# Patient Record
Sex: Male | Born: 1950
Health system: Southern US, Community
[De-identification: ages and names within clinical notes are randomized; demographics above are authoritative.]

## PROBLEM LIST (undated history)

## (undated) DIAGNOSIS — F329 Major depressive disorder, single episode, unspecified: Secondary | ICD-10-CM

## (undated) DIAGNOSIS — F4310 Post-traumatic stress disorder, unspecified: Secondary | ICD-10-CM

## (undated) DIAGNOSIS — F32A Depression, unspecified: Secondary | ICD-10-CM

## (undated) HISTORY — PX: SINUSOTOMY: SHX291

## (undated) HISTORY — DX: Major depressive disorder, single episode, unspecified: F32.9

## (undated) HISTORY — PX: OTHER SURGICAL HISTORY: SHX169

## (undated) HISTORY — DX: Depression, unspecified: F32.A

## (undated) HISTORY — PX: ANKLE FRACTURE SURGERY: SHX122

## (undated) HISTORY — DX: Post-traumatic stress disorder, unspecified: F43.10

---

## 2003-10-18 ENCOUNTER — Encounter: Admission: RE | Admit: 2003-10-18 | Discharge: 2003-10-18 | Payer: Self-pay | Admitting: Family Medicine

## 2007-01-29 ENCOUNTER — Emergency Department: Payer: Self-pay | Admitting: Emergency Medicine

## 2007-08-27 ENCOUNTER — Ambulatory Visit: Payer: Self-pay | Admitting: Gastroenterology

## 2007-09-10 ENCOUNTER — Ambulatory Visit: Payer: Self-pay | Admitting: Gastroenterology

## 2008-05-07 ENCOUNTER — Emergency Department: Payer: Self-pay | Admitting: Emergency Medicine

## 2008-05-10 ENCOUNTER — Ambulatory Visit: Payer: Self-pay | Admitting: Unknown Physician Specialty

## 2008-08-23 ENCOUNTER — Telehealth: Payer: Self-pay | Admitting: Gastroenterology

## 2008-08-24 ENCOUNTER — Encounter (INDEPENDENT_AMBULATORY_CARE_PROVIDER_SITE_OTHER): Payer: Self-pay | Admitting: *Deleted

## 2010-08-13 NOTE — Letter (Signed)
Summary: Recall Colonoscopy Letter  [facility]  [address] [place], [place] [address]   Phone: [phone]  Fax: [phone]      August 24, 2008 MRN: [id number]   ATIBA KIMBERLIN [address] [place], [place]  [address]   Dear Mr. KERSH,   According to your medical record, it is time for you to schedule a Colonoscopy. The American Cancer Society recommends this procedure as a method to detect early colon cancer. Patients with a family history of colon cancer, or a personal history of colon polyps or inflammatory bowel disease are at increased risk.  This letter has beeen generated based on the recommendations made at the time of your procedure. If you feel that in your particular situation this may no longer apply, please contact our office.  Please call our office at [phone] to schedule this appointment or to update your records at your earliest convenience.  Thank you for cooperating with [place] to provide ou with the very best care possible.   Sincerely,  Vania Rea. Jarold Motto, M.D.  [facility] [phone]

## 2014-08-31 DIAGNOSIS — R05 Cough: Secondary | ICD-10-CM | POA: Diagnosis not present

## 2014-09-04 DIAGNOSIS — R05 Cough: Secondary | ICD-10-CM | POA: Diagnosis not present

## 2014-09-04 DIAGNOSIS — R918 Other nonspecific abnormal finding of lung field: Secondary | ICD-10-CM | POA: Diagnosis not present

## 2014-12-04 ENCOUNTER — Other Ambulatory Visit: Payer: Self-pay | Admitting: Family Medicine

## 2014-12-04 DIAGNOSIS — R911 Solitary pulmonary nodule: Secondary | ICD-10-CM

## 2014-12-06 DIAGNOSIS — Z79899 Other long term (current) drug therapy: Secondary | ICD-10-CM | POA: Diagnosis not present

## 2014-12-08 ENCOUNTER — Ambulatory Visit
Admission: RE | Admit: 2014-12-08 | Discharge: 2014-12-08 | Disposition: A | Discharge status: Home / Self Care | Payer: Commercial Managed Care - HMO | Source: Ambulatory Visit | Attending: Family Medicine | Admitting: Family Medicine

## 2014-12-08 DIAGNOSIS — J929 Pleural plaque without asbestos: Secondary | ICD-10-CM | POA: Diagnosis not present

## 2014-12-08 DIAGNOSIS — R911 Solitary pulmonary nodule: Secondary | ICD-10-CM

## 2014-12-08 DIAGNOSIS — I700 Atherosclerosis of aorta: Secondary | ICD-10-CM | POA: Diagnosis not present

## 2014-12-08 MED ORDER — IOPAMIDOL (ISOVUE-300) INJECTION 61%
75.0000 mL | Freq: Once | INTRAVENOUS | Status: AC | PRN
  Administered 2014-12-08: 75 mL via INTRAVENOUS

## 2015-01-09 DIAGNOSIS — Z1211 Encounter for screening for malignant neoplasm of colon: Secondary | ICD-10-CM | POA: Diagnosis not present

## 2015-01-09 DIAGNOSIS — Z0000 Encounter for general adult medical examination without abnormal findings: Secondary | ICD-10-CM | POA: Diagnosis not present

## 2015-01-09 DIAGNOSIS — Z125 Encounter for screening for malignant neoplasm of prostate: Secondary | ICD-10-CM | POA: Diagnosis not present

## 2015-01-09 DIAGNOSIS — Z23 Encounter for immunization: Secondary | ICD-10-CM | POA: Diagnosis not present

## 2015-01-09 DIAGNOSIS — J309 Allergic rhinitis, unspecified: Secondary | ICD-10-CM | POA: Diagnosis not present

## 2015-01-09 DIAGNOSIS — E780 Pure hypercholesterolemia: Secondary | ICD-10-CM | POA: Diagnosis not present

## 2015-01-18 DIAGNOSIS — Z1211 Encounter for screening for malignant neoplasm of colon: Secondary | ICD-10-CM | POA: Diagnosis not present

## 2015-02-05 ENCOUNTER — Encounter: Payer: Self-pay | Admitting: Gastroenterology

## 2015-02-06 DIAGNOSIS — K120 Recurrent oral aphthae: Secondary | ICD-10-CM | POA: Diagnosis not present

## 2015-03-15 DIAGNOSIS — E780 Pure hypercholesterolemia: Secondary | ICD-10-CM | POA: Diagnosis not present

## 2015-08-17 DIAGNOSIS — H6120 Impacted cerumen, unspecified ear: Secondary | ICD-10-CM | POA: Diagnosis not present

## 2015-08-17 DIAGNOSIS — Z23 Encounter for immunization: Secondary | ICD-10-CM | POA: Diagnosis not present

## 2015-08-17 DIAGNOSIS — R42 Dizziness and giddiness: Secondary | ICD-10-CM | POA: Diagnosis not present

## 2015-08-17 DIAGNOSIS — N529 Male erectile dysfunction, unspecified: Secondary | ICD-10-CM | POA: Diagnosis not present

## 2015-08-17 DIAGNOSIS — J309 Allergic rhinitis, unspecified: Secondary | ICD-10-CM | POA: Diagnosis not present

## 2015-09-14 DIAGNOSIS — J309 Allergic rhinitis, unspecified: Secondary | ICD-10-CM | POA: Diagnosis not present

## 2015-09-28 DIAGNOSIS — R0981 Nasal congestion: Secondary | ICD-10-CM | POA: Diagnosis not present

## 2015-09-28 DIAGNOSIS — R05 Cough: Secondary | ICD-10-CM | POA: Diagnosis not present

## 2015-09-28 DIAGNOSIS — R0982 Postnasal drip: Secondary | ICD-10-CM | POA: Diagnosis not present

## 2015-09-28 DIAGNOSIS — J342 Deviated nasal septum: Secondary | ICD-10-CM | POA: Diagnosis not present

## 2015-12-17 DIAGNOSIS — H179 Unspecified corneal scar and opacity: Secondary | ICD-10-CM | POA: Diagnosis not present

## 2015-12-17 DIAGNOSIS — H2513 Age-related nuclear cataract, bilateral: Secondary | ICD-10-CM | POA: Diagnosis not present

## 2016-01-16 DIAGNOSIS — Z23 Encounter for immunization: Secondary | ICD-10-CM | POA: Diagnosis not present

## 2016-01-16 DIAGNOSIS — Z125 Encounter for screening for malignant neoplasm of prostate: Secondary | ICD-10-CM | POA: Diagnosis not present

## 2016-01-16 DIAGNOSIS — E7800 Pure hypercholesterolemia, unspecified: Secondary | ICD-10-CM | POA: Diagnosis not present

## 2016-01-16 DIAGNOSIS — M545 Low back pain: Secondary | ICD-10-CM | POA: Diagnosis not present

## 2016-01-16 DIAGNOSIS — J309 Allergic rhinitis, unspecified: Secondary | ICD-10-CM | POA: Diagnosis not present

## 2016-01-16 DIAGNOSIS — Z1211 Encounter for screening for malignant neoplasm of colon: Secondary | ICD-10-CM | POA: Diagnosis not present

## 2016-01-16 DIAGNOSIS — Z0000 Encounter for general adult medical examination without abnormal findings: Secondary | ICD-10-CM | POA: Diagnosis not present

## 2016-01-16 DIAGNOSIS — N529 Male erectile dysfunction, unspecified: Secondary | ICD-10-CM | POA: Diagnosis not present

## 2016-01-24 DIAGNOSIS — Z1211 Encounter for screening for malignant neoplasm of colon: Secondary | ICD-10-CM | POA: Diagnosis not present

## 2016-04-14 DIAGNOSIS — Z87891 Personal history of nicotine dependence: Secondary | ICD-10-CM | POA: Diagnosis not present

## 2016-04-14 DIAGNOSIS — J329 Chronic sinusitis, unspecified: Secondary | ICD-10-CM | POA: Diagnosis not present

## 2016-04-14 DIAGNOSIS — J339 Nasal polyp, unspecified: Secondary | ICD-10-CM | POA: Diagnosis not present

## 2016-04-14 DIAGNOSIS — J310 Chronic rhinitis: Secondary | ICD-10-CM | POA: Diagnosis not present

## 2016-04-23 DIAGNOSIS — R062 Wheezing: Secondary | ICD-10-CM | POA: Diagnosis not present

## 2016-04-23 DIAGNOSIS — Z87891 Personal history of nicotine dependence: Secondary | ICD-10-CM | POA: Diagnosis not present

## 2016-04-23 DIAGNOSIS — J339 Nasal polyp, unspecified: Secondary | ICD-10-CM | POA: Diagnosis not present

## 2016-04-23 DIAGNOSIS — J329 Chronic sinusitis, unspecified: Secondary | ICD-10-CM | POA: Diagnosis not present

## 2016-07-01 DIAGNOSIS — H5022 Vertical strabismus, left eye: Secondary | ICD-10-CM | POA: Diagnosis not present

## 2016-07-01 DIAGNOSIS — H2513 Age-related nuclear cataract, bilateral: Secondary | ICD-10-CM | POA: Diagnosis not present

## 2016-07-01 DIAGNOSIS — H532 Diplopia: Secondary | ICD-10-CM | POA: Diagnosis not present

## 2016-07-01 DIAGNOSIS — H4912 Fourth [trochlear] nerve palsy, left eye: Secondary | ICD-10-CM | POA: Diagnosis not present

## 2016-07-21 DIAGNOSIS — E7800 Pure hypercholesterolemia, unspecified: Secondary | ICD-10-CM | POA: Diagnosis not present

## 2016-07-22 DIAGNOSIS — J309 Allergic rhinitis, unspecified: Secondary | ICD-10-CM | POA: Diagnosis not present

## 2016-07-22 DIAGNOSIS — R42 Dizziness and giddiness: Secondary | ICD-10-CM | POA: Diagnosis not present

## 2016-07-22 DIAGNOSIS — E7800 Pure hypercholesterolemia, unspecified: Secondary | ICD-10-CM | POA: Diagnosis not present

## 2016-08-26 DIAGNOSIS — J3489 Other specified disorders of nose and nasal sinuses: Secondary | ICD-10-CM | POA: Insufficient documentation

## 2016-08-26 DIAGNOSIS — J343 Hypertrophy of nasal turbinates: Secondary | ICD-10-CM | POA: Diagnosis not present

## 2016-08-26 DIAGNOSIS — J342 Deviated nasal septum: Secondary | ICD-10-CM | POA: Diagnosis not present

## 2016-09-01 DIAGNOSIS — J3489 Other specified disorders of nose and nasal sinuses: Secondary | ICD-10-CM | POA: Diagnosis not present

## 2016-09-01 DIAGNOSIS — J342 Deviated nasal septum: Secondary | ICD-10-CM | POA: Diagnosis not present

## 2016-09-01 DIAGNOSIS — J343 Hypertrophy of nasal turbinates: Secondary | ICD-10-CM | POA: Diagnosis not present

## 2016-09-05 DIAGNOSIS — H4912 Fourth [trochlear] nerve palsy, left eye: Secondary | ICD-10-CM | POA: Diagnosis not present

## 2016-09-05 DIAGNOSIS — H5022 Vertical strabismus, left eye: Secondary | ICD-10-CM | POA: Diagnosis not present

## 2016-09-05 DIAGNOSIS — H532 Diplopia: Secondary | ICD-10-CM | POA: Diagnosis not present

## 2016-10-02 DIAGNOSIS — H5021 Vertical strabismus, right eye: Secondary | ICD-10-CM | POA: Diagnosis not present

## 2016-10-02 DIAGNOSIS — H2513 Age-related nuclear cataract, bilateral: Secondary | ICD-10-CM | POA: Diagnosis not present

## 2016-10-02 DIAGNOSIS — H524 Presbyopia: Secondary | ICD-10-CM | POA: Diagnosis not present

## 2016-10-03 DIAGNOSIS — J338 Other polyp of sinus: Secondary | ICD-10-CM | POA: Diagnosis not present

## 2016-10-03 DIAGNOSIS — J343 Hypertrophy of nasal turbinates: Secondary | ICD-10-CM | POA: Diagnosis not present

## 2016-10-03 DIAGNOSIS — J320 Chronic maxillary sinusitis: Secondary | ICD-10-CM | POA: Diagnosis not present

## 2016-10-03 DIAGNOSIS — J309 Allergic rhinitis, unspecified: Secondary | ICD-10-CM | POA: Diagnosis not present

## 2016-10-03 DIAGNOSIS — J342 Deviated nasal septum: Secondary | ICD-10-CM | POA: Diagnosis not present

## 2016-10-03 DIAGNOSIS — J324 Chronic pansinusitis: Secondary | ICD-10-CM | POA: Diagnosis not present

## 2016-10-03 DIAGNOSIS — J321 Chronic frontal sinusitis: Secondary | ICD-10-CM | POA: Diagnosis not present

## 2016-10-03 DIAGNOSIS — J322 Chronic ethmoidal sinusitis: Secondary | ICD-10-CM | POA: Diagnosis not present

## 2016-10-03 DIAGNOSIS — J323 Chronic sphenoidal sinusitis: Secondary | ICD-10-CM | POA: Diagnosis not present

## 2016-10-06 ENCOUNTER — Encounter: Payer: Self-pay | Admitting: Emergency Medicine

## 2016-10-06 ENCOUNTER — Emergency Department: Payer: Medicare HMO

## 2016-10-06 ENCOUNTER — Emergency Department
Admission: EM | Admit: 2016-10-06 | Discharge: 2016-10-06 | Disposition: A | Discharge status: Other Acute Inpatient Hospital | Payer: Medicare HMO | Attending: Emergency Medicine | Admitting: Emergency Medicine

## 2016-10-06 DIAGNOSIS — Y9389 Activity, other specified: Secondary | ICD-10-CM | POA: Diagnosis not present

## 2016-10-06 DIAGNOSIS — W268XXA Contact with other sharp object(s), not elsewhere classified, initial encounter: Secondary | ICD-10-CM | POA: Insufficient documentation

## 2016-10-06 DIAGNOSIS — S61210A Laceration without foreign body of right index finger without damage to nail, initial encounter: Secondary | ICD-10-CM | POA: Diagnosis not present

## 2016-10-06 DIAGNOSIS — Y999 Unspecified external cause status: Secondary | ICD-10-CM | POA: Insufficient documentation

## 2016-10-06 DIAGNOSIS — S61111A Laceration without foreign body of right thumb with damage to nail, initial encounter: Secondary | ICD-10-CM | POA: Diagnosis not present

## 2016-10-06 DIAGNOSIS — W312XXA Contact with powered woodworking and forming machines, initial encounter: Secondary | ICD-10-CM | POA: Diagnosis not present

## 2016-10-06 DIAGNOSIS — Z5181 Encounter for therapeutic drug level monitoring: Secondary | ICD-10-CM | POA: Diagnosis not present

## 2016-10-06 DIAGNOSIS — Y929 Unspecified place or not applicable: Secondary | ICD-10-CM | POA: Insufficient documentation

## 2016-10-06 DIAGNOSIS — S62632A Displaced fracture of distal phalanx of right middle finger, initial encounter for closed fracture: Secondary | ICD-10-CM | POA: Diagnosis not present

## 2016-10-06 DIAGNOSIS — Z87891 Personal history of nicotine dependence: Secondary | ICD-10-CM | POA: Insufficient documentation

## 2016-10-06 DIAGNOSIS — S68021A Partial traumatic metacarpophalangeal amputation of right thumb, initial encounter: Secondary | ICD-10-CM | POA: Diagnosis not present

## 2016-10-06 DIAGNOSIS — M6250 Muscle wasting and atrophy, not elsewhere classified, unspecified site: Secondary | ICD-10-CM | POA: Diagnosis not present

## 2016-10-06 DIAGNOSIS — S98911A Complete traumatic amputation of right foot, level unspecified, initial encounter: Secondary | ICD-10-CM | POA: Diagnosis not present

## 2016-10-06 DIAGNOSIS — S61112A Laceration without foreign body of left thumb with damage to nail, initial encounter: Secondary | ICD-10-CM | POA: Diagnosis not present

## 2016-10-06 DIAGNOSIS — S62630A Displaced fracture of distal phalanx of right index finger, initial encounter for closed fracture: Secondary | ICD-10-CM | POA: Diagnosis not present

## 2016-10-06 DIAGNOSIS — S68120A Partial traumatic metacarpophalangeal amputation of right index finger, initial encounter: Secondary | ICD-10-CM | POA: Diagnosis not present

## 2016-10-06 DIAGNOSIS — R2990 Unspecified symptoms and signs involving the nervous system: Secondary | ICD-10-CM | POA: Diagnosis not present

## 2016-10-06 MED ORDER — CEFAZOLIN IN D5W 1 GM/50ML IV SOLN
1.0000 g | Freq: Once | INTRAVENOUS | Status: AC
Start: 2016-10-06 — End: 2016-10-06
  Administered 2016-10-06: 1 g via INTRAVENOUS
  Filled 2016-10-06: qty 50

## 2016-10-06 NOTE — ED Provider Notes (Signed)
[facility] Emergency Department Provider Note  ____________________________________________   I have reviewed the triage vital signs and the nursing notes.   HISTORY  Chief Complaint Hand Injury   History limited by: Not Limited   HPI Antonio Cisneros is a 66 y.o. male who presents to the emergency department today after a table saw incident and right hand injury. The patient states he cut off the distal half of his thumb and also injured his index finger. The patient denies any other injury. Denies any history of heart or lung disease. Not on any blood thinners.    No past medical history on file.  There are no active problems to display for this patient.   No past surgical history on file.  Prior to Admission medications   Not on File    Allergies Patient has no allergy information on record.  No family history on file.  Social History Social History  Substance Use Topics  . Smoking status: Former Smoker    Quit date: 1989  . Smokeless tobacco: Never Used  . Alcohol use No    Review of Systems  Constitutional: Negative for fever. Cardiovascular: Negative for chest pain. Respiratory: Negative for shortness of breath. Gastrointestinal: Negative for abdominal pain, vomiting and diarrhea. Musculoskeletal: Positive for right hand injury.  Neurological: Negative for headaches, focal weakness or numbness.  10-point ROS otherwise negative.  ____________________________________________   PHYSICAL EXAM:  VITAL SIGNS: ED Triage Vitals  Enc Vitals Group     BP 142/88     Pulse 85     Resp 18     Temp 97.6     Temp src      SpO2 96   Constitutional: Alert and oriented. Well appearing and in no distress. Eyes: Conjunctivae are normal. Normal extraocular movements. ENT   Head: Normocephalic and atraumatic.   Nose: No congestion/rhinnorhea.   Mouth/Throat: Mucous membranes are moist.   Neck: No  stridor. Hematological/Lymphatic/Immunilogical: No cervical lymphadenopathy. Cardiovascular: Normal rate, regular rhythm.  No murmurs, rubs, or gallops.  Respiratory: Normal respiratory effort without tachypnea nor retractions. Breath sounds are clear and equal bilaterally. No wheezes/rales/rhonchi. Gastrointestinal: Soft and non tender. No rebound. No guarding.  Genitourinary: Deferred Musculoskeletal: Right thumb with traumatic amputation roughly midway through the distal phalanx. Right index finger with longitudinal laceration with bone exposure. Disarticulation of the PIP. Sensation intact to finger tip.  Neurologic:  Normal speech and language. No gross focal neurologic deficits are appreciated.  Skin:  Skin is warm, dry and intact. No rash noted. Psychiatric: Mood and affect are normal. Speech and behavior are normal. Patient exhibits appropriate insight and judgment.  ____________________________________________    LABS (pertinent positives/negatives)  None  ____________________________________________   EKG  None  ____________________________________________    RADIOLOGY  Right hand IMPRESSION: Amputation of the distal aspect of the thumb.  Open fracture involving the second proximal phalanx   ____________________________________________   PROCEDURES  Procedures  ____________________________________________   INITIAL IMPRESSION / ASSESSMENT AND PLAN / ED COURSE  Pertinent labs & imaging results that were available during my care of the patient were reviewed by me and considered in my medical decision making (see chart for details).  Patient presented to the emergency department today with concerns for right hand injury due to table saw injury. The patient did have a amputation of the distal half of his distal phalanx of the right thumb. The right index finger is of more concern with longitudinal laceration with bone involvement. Disarticulation  of the PIP.  Given significant injury [facility].  ____________________________________________   FINAL CLINICAL IMPRESSION(S) / ED DIAGNOSES  Final diagnoses:  Laceration of right index finger, foreign body presence unspecified, nail damage status unspecified, initial encounter     Note: This dictation was prepared with Dragon dictation. Any transcriptional errors that result from this process are unintentional     Phineas SemenGraydon Knute Mazzuca, MD 10/06/16 1542

## 2016-10-06 NOTE — ED Triage Notes (Signed)
Patient presents to the ED with tip of right thumb amputated and deep laceration to 2nd finger on right hand.  Patient reports using a table saw and cut his thumb with it accidentally.  Significant amount of bleeding from thumb.  Patient is alert and oriented x 4.  In no obvious distress.

## 2016-10-06 NOTE — ED Notes (Signed)
EMS to transport patient to [facility] at this time.

## 2016-10-06 NOTE — ED Notes (Signed)
Patient returned from xray and suture cart placed in room at this time.

## 2016-10-07 ENCOUNTER — Emergency Department
Admission: EM | Admit: 2016-10-07 | Discharge: 2016-10-07 | Disposition: A | Discharge status: Home / Self Care | Payer: Medicare HMO | Attending: Emergency Medicine | Admitting: Emergency Medicine

## 2016-10-07 ENCOUNTER — Encounter: Payer: Self-pay | Admitting: *Deleted

## 2016-10-07 DIAGNOSIS — G8918 Other acute postprocedural pain: Secondary | ICD-10-CM | POA: Insufficient documentation

## 2016-10-07 DIAGNOSIS — M79644 Pain in right finger(s): Secondary | ICD-10-CM | POA: Insufficient documentation

## 2016-10-07 DIAGNOSIS — Z87891 Personal history of nicotine dependence: Secondary | ICD-10-CM | POA: Insufficient documentation

## 2016-10-07 NOTE — ED Notes (Signed)
Pt had surgery today at [facility] on right hand.  Pt was seen in er yesterday with laceration,amputation to right thumb/hand.  Today, pt returns to er with increased pain of right hand and pt removed splint from right hand.  Bleeding controlled.  Pt alert.  Family with pt.

## 2016-10-07 NOTE — ED Notes (Signed)
Right hand cleaned and dressed with 4x4's and gauze.  Pt tolerated well.

## 2016-10-07 NOTE — ED Triage Notes (Signed)
Pt was seen in ED yesterday for laceration of right thumb and pointer, states he was sent to [facility], states he was discharged this AM, upon arriving home pt removed surgical dressing and states "they messed me up", bleeding in triage, pt taking percocet for pain, in tirage pt continues to mess with hand, touching and moving it, states "they didn't know what they were doing"

## 2016-10-07 NOTE — ED Provider Notes (Signed)
[facility] Emergency Department Provider Note  ____________________________________________   First MD Initiated Contact with Patient 10/07/16 2150     (approximate)  I have reviewed the triage vital signs and the nursing notes.   HISTORY  Chief Complaint Post-op Problem   HPI Joelene MillinMichael F Errico is a 66 y.o. male who one day ago had a partial right thumb amputation as well as index finger injury who is presenting to the emergency department today after removing his splint at home. He says that he was bleeding from his incision and that his splint was too painful. He removed the splint and then came to this emergency department for further evaluation. He says that he is able to feel his thumb and able to move his thumb on the right hand. However, he had pinning done to his right index finger and is able to move it at this time. However, he is sensate. Much more comfortable since removing the splint.   History reviewed. No pertinent past medical history.  There are no active problems to display for this patient.   Past Surgical History:  Procedure Laterality Date  . ANKLE FRACTURE SURGERY      Prior to Admission medications   Not on File    Allergies Patient has no known allergies.  History reviewed. No pertinent family history.  Social History Social History  Substance Use Topics  . Smoking status: Former Smoker    Quit date: 1989  . Smokeless tobacco: Never Used  . Alcohol use No    Review of Systems Constitutional: No fever/chills Eyes: No visual changes. ENT: No sore throat. Cardiovascular: Denies chest pain. Respiratory: Denies shortness of breath. Gastrointestinal: No abdominal pain.  No nausea, no vomiting.  No diarrhea.  No constipation. Genitourinary: Negative for dysuria. Musculoskeletal: Negative for back pain. Skin: Negative for rash. Neurological: Negative for headaches, focal weakness or numbness.  10-point ROS otherwise  negative.  ____________________________________________   PHYSICAL EXAM:  VITAL SIGNS: ED Triage Vitals [10/07/16 1717]  Enc Vitals Group     BP (!) 163/88     Pulse Rate (!) 107     Resp 20     Temp 97.7 F (36.5 C)     Temp Source Temporal     SpO2 95 %     Weight 196 lb (88.9 kg)     Height 5\' 9"  (1.753 m)     Head Circumference      Peak Flow      Pain Score 3     Pain Loc      Pain Edu?      Excl. in GC?     Constitutional: Alert and oriented. Well appearing and in no acute distress. Eyes: Conjunctivae are normal. PERRL. EOMI. Head: Atraumatic. Nose: No congestion/rhinnorhea. Mouth/Throat: Mucous membranes are moist.   Neck: No stridor.   Cardiovascular: Normal rate, regular rhythm. Grossly normal heart sounds.   Respiratory: Normal respiratory effort.  No retractions. Lungs CTAB. Gastrointestinal: Soft and nontender. No distention.  Musculoskeletal: No lower extremity tenderness nor edema.  No joint effusions.  Right thumb with still distal end dictation with healthy-appearing granulation tissue. Able to range the right thumb. No induration or pus or fluctuance noted. Also sensate to the thumb on the right hand.  Right index finger held in extension. Patient is sensate to the tip of the thumb and there is brisk capillary refill. No active bleeding from the tip of the thumb where there are staples nor is  there active bleeding from the suture line to the dorsum of the finger. There is dried blood present but no active bleeding. No erythema, fluctuance or pus.  Neurologic:  Normal speech and language. No gross focal neurologic deficits are appreciated. No gait instability. Skin:  Skin is warm, dry  Psychiatric: Mood and affect are normal. Speech and behavior are normal.  ____________________________________________   LABS (all labs ordered are listed, but only abnormal results are displayed)  Labs Reviewed - No data to  display ____________________________________________  EKG   ____________________________________________  RADIOLOGY   ____________________________________________   PROCEDURES  Procedure(s) performed:   Procedures  Critical Care performed:   ____________________________________________   INITIAL IMPRESSION / ASSESSMENT AND PLAN / ED COURSE  Pertinent labs & imaging results that were available during my care of the patient were reviewed by me and considered in my medical decision making (see chart for details).  ----------------------------------------- 11:35 PM on 10/07/2016 -----------------------------------------  Patient's wounds cleansed and redressed. Placed in a volar splint. Says that he is comfortable splint. He is neurovascularly intact and the splint. He'll be discharged home and will be following up with his hand surgeon as planned.      ____________________________________________   FINAL CLINICAL IMPRESSION(S) / ED DIAGNOSES  Postop bleeding.    NEW MEDICATIONS STARTED DURING THIS VISIT:  New Prescriptions   No medications on file     Note:  This document was prepared using Dragon voice recognition software and may include unintentional dictation errors.    Myrna Blazer, MD 10/07/16 [phone]

## 2016-10-08 DIAGNOSIS — J324 Chronic pansinusitis: Secondary | ICD-10-CM | POA: Insufficient documentation

## 2016-10-22 DIAGNOSIS — S62630K Displaced fracture of distal phalanx of right index finger, subsequent encounter for fracture with nonunion: Secondary | ICD-10-CM | POA: Diagnosis not present

## 2016-10-22 DIAGNOSIS — S62621K Displaced fracture of medial phalanx of left index finger, subsequent encounter for fracture with nonunion: Secondary | ICD-10-CM | POA: Insufficient documentation

## 2016-10-22 DIAGNOSIS — Y33XXXD Other specified events, undetermined intent, subsequent encounter: Secondary | ICD-10-CM | POA: Diagnosis not present

## 2016-10-23 DIAGNOSIS — J324 Chronic pansinusitis: Secondary | ICD-10-CM | POA: Diagnosis not present

## 2016-10-23 DIAGNOSIS — Z87891 Personal history of nicotine dependence: Secondary | ICD-10-CM | POA: Diagnosis not present

## 2016-11-06 DIAGNOSIS — J324 Chronic pansinusitis: Secondary | ICD-10-CM | POA: Diagnosis not present

## 2016-11-06 DIAGNOSIS — Z87891 Personal history of nicotine dependence: Secondary | ICD-10-CM | POA: Diagnosis not present

## 2016-11-06 DIAGNOSIS — J343 Hypertrophy of nasal turbinates: Secondary | ICD-10-CM | POA: Diagnosis not present

## 2016-11-19 DIAGNOSIS — M7989 Other specified soft tissue disorders: Secondary | ICD-10-CM | POA: Diagnosis not present

## 2016-11-19 DIAGNOSIS — M85841 Other specified disorders of bone density and structure, right hand: Secondary | ICD-10-CM | POA: Diagnosis not present

## 2016-11-19 DIAGNOSIS — S62621K Displaced fracture of medial phalanx of left index finger, subsequent encounter for fracture with nonunion: Secondary | ICD-10-CM | POA: Diagnosis not present

## 2016-11-19 DIAGNOSIS — Y33XXXD Other specified events, undetermined intent, subsequent encounter: Secondary | ICD-10-CM | POA: Diagnosis not present

## 2016-11-20 DIAGNOSIS — M19171 Post-traumatic osteoarthritis, right ankle and foot: Secondary | ICD-10-CM | POA: Diagnosis not present

## 2016-11-20 DIAGNOSIS — M25571 Pain in right ankle and joints of right foot: Secondary | ICD-10-CM | POA: Diagnosis not present

## 2016-11-26 DIAGNOSIS — M25642 Stiffness of left hand, not elsewhere classified: Secondary | ICD-10-CM | POA: Diagnosis not present

## 2016-12-05 DIAGNOSIS — M25642 Stiffness of left hand, not elsewhere classified: Secondary | ICD-10-CM | POA: Diagnosis not present

## 2016-12-09 DIAGNOSIS — J343 Hypertrophy of nasal turbinates: Secondary | ICD-10-CM | POA: Diagnosis not present

## 2016-12-09 DIAGNOSIS — Z87891 Personal history of nicotine dependence: Secondary | ICD-10-CM | POA: Diagnosis not present

## 2016-12-09 DIAGNOSIS — J342 Deviated nasal septum: Secondary | ICD-10-CM | POA: Diagnosis not present

## 2016-12-09 DIAGNOSIS — J324 Chronic pansinusitis: Secondary | ICD-10-CM | POA: Diagnosis not present

## 2016-12-09 DIAGNOSIS — M25642 Stiffness of left hand, not elsewhere classified: Secondary | ICD-10-CM | POA: Diagnosis not present

## 2016-12-12 DIAGNOSIS — M25642 Stiffness of left hand, not elsewhere classified: Secondary | ICD-10-CM | POA: Diagnosis not present

## 2016-12-16 DIAGNOSIS — M25642 Stiffness of left hand, not elsewhere classified: Secondary | ICD-10-CM | POA: Diagnosis not present

## 2016-12-26 DIAGNOSIS — M25642 Stiffness of left hand, not elsewhere classified: Secondary | ICD-10-CM | POA: Diagnosis not present

## 2016-12-31 DIAGNOSIS — M25642 Stiffness of left hand, not elsewhere classified: Secondary | ICD-10-CM | POA: Diagnosis not present

## 2017-01-07 DIAGNOSIS — M25642 Stiffness of left hand, not elsewhere classified: Secondary | ICD-10-CM | POA: Diagnosis not present

## 2017-01-09 DIAGNOSIS — M25642 Stiffness of left hand, not elsewhere classified: Secondary | ICD-10-CM | POA: Diagnosis not present

## 2017-01-12 DIAGNOSIS — M79641 Pain in right hand: Secondary | ICD-10-CM | POA: Diagnosis not present

## 2017-01-20 DIAGNOSIS — M25642 Stiffness of left hand, not elsewhere classified: Secondary | ICD-10-CM | POA: Diagnosis not present

## 2017-01-27 DIAGNOSIS — M25642 Stiffness of left hand, not elsewhere classified: Secondary | ICD-10-CM | POA: Diagnosis not present

## 2017-01-29 DIAGNOSIS — S56429A Laceration of extensor muscle, fascia and tendon of unspecified finger at forearm level, initial encounter: Secondary | ICD-10-CM | POA: Insufficient documentation

## 2017-01-29 DIAGNOSIS — S61209A Unspecified open wound of unspecified finger without damage to nail, initial encounter: Secondary | ICD-10-CM | POA: Insufficient documentation

## 2017-01-29 DIAGNOSIS — S61209D Unspecified open wound of unspecified finger without damage to nail, subsequent encounter: Secondary | ICD-10-CM | POA: Diagnosis not present

## 2017-01-29 DIAGNOSIS — S66529D Laceration of intrinsic muscle, fascia and tendon of unspecified finger at wrist and hand level, subsequent encounter: Secondary | ICD-10-CM | POA: Diagnosis not present

## 2017-01-29 DIAGNOSIS — M79641 Pain in right hand: Secondary | ICD-10-CM | POA: Insufficient documentation

## 2017-02-03 DIAGNOSIS — M79641 Pain in right hand: Secondary | ICD-10-CM | POA: Diagnosis not present

## 2017-02-03 DIAGNOSIS — S61209D Unspecified open wound of unspecified finger without damage to nail, subsequent encounter: Secondary | ICD-10-CM | POA: Diagnosis not present

## 2017-02-03 DIAGNOSIS — S66529D Laceration of intrinsic muscle, fascia and tendon of unspecified finger at wrist and hand level, subsequent encounter: Secondary | ICD-10-CM | POA: Diagnosis not present

## 2017-02-03 DIAGNOSIS — M25649 Stiffness of unspecified hand, not elsewhere classified: Secondary | ICD-10-CM | POA: Diagnosis not present

## 2017-02-03 DIAGNOSIS — M25639 Stiffness of unspecified wrist, not elsewhere classified: Secondary | ICD-10-CM | POA: Diagnosis not present

## 2017-02-10 DIAGNOSIS — S66529D Laceration of intrinsic muscle, fascia and tendon of unspecified finger at wrist and hand level, subsequent encounter: Secondary | ICD-10-CM | POA: Diagnosis not present

## 2017-02-10 DIAGNOSIS — M79641 Pain in right hand: Secondary | ICD-10-CM | POA: Diagnosis not present

## 2017-02-10 DIAGNOSIS — S61209D Unspecified open wound of unspecified finger without damage to nail, subsequent encounter: Secondary | ICD-10-CM | POA: Diagnosis not present

## 2017-02-10 DIAGNOSIS — M25649 Stiffness of unspecified hand, not elsewhere classified: Secondary | ICD-10-CM | POA: Diagnosis not present

## 2017-02-10 DIAGNOSIS — M79644 Pain in right finger(s): Secondary | ICD-10-CM | POA: Diagnosis not present

## 2017-02-10 DIAGNOSIS — M25639 Stiffness of unspecified wrist, not elsewhere classified: Secondary | ICD-10-CM | POA: Diagnosis not present

## 2017-02-17 DIAGNOSIS — S61209D Unspecified open wound of unspecified finger without damage to nail, subsequent encounter: Secondary | ICD-10-CM | POA: Diagnosis not present

## 2017-02-17 DIAGNOSIS — M79641 Pain in right hand: Secondary | ICD-10-CM | POA: Diagnosis not present

## 2017-02-17 DIAGNOSIS — M25649 Stiffness of unspecified hand, not elsewhere classified: Secondary | ICD-10-CM | POA: Diagnosis not present

## 2017-02-17 DIAGNOSIS — M25639 Stiffness of unspecified wrist, not elsewhere classified: Secondary | ICD-10-CM | POA: Diagnosis not present

## 2017-02-17 DIAGNOSIS — S66529D Laceration of intrinsic muscle, fascia and tendon of unspecified finger at wrist and hand level, subsequent encounter: Secondary | ICD-10-CM | POA: Diagnosis not present

## 2017-02-17 DIAGNOSIS — M79644 Pain in right finger(s): Secondary | ICD-10-CM | POA: Diagnosis not present

## 2017-02-20 DIAGNOSIS — I10 Essential (primary) hypertension: Secondary | ICD-10-CM | POA: Diagnosis not present

## 2017-02-20 DIAGNOSIS — R030 Elevated blood-pressure reading, without diagnosis of hypertension: Secondary | ICD-10-CM | POA: Diagnosis not present

## 2017-02-26 DIAGNOSIS — S61209D Unspecified open wound of unspecified finger without damage to nail, subsequent encounter: Secondary | ICD-10-CM | POA: Diagnosis not present

## 2017-02-26 DIAGNOSIS — S66529D Laceration of intrinsic muscle, fascia and tendon of unspecified finger at wrist and hand level, subsequent encounter: Secondary | ICD-10-CM | POA: Diagnosis not present

## 2017-02-26 DIAGNOSIS — M79641 Pain in right hand: Secondary | ICD-10-CM | POA: Diagnosis not present

## 2017-03-09 DIAGNOSIS — S66529D Laceration of intrinsic muscle, fascia and tendon of unspecified finger at wrist and hand level, subsequent encounter: Secondary | ICD-10-CM | POA: Diagnosis not present

## 2017-03-09 DIAGNOSIS — S61209D Unspecified open wound of unspecified finger without damage to nail, subsequent encounter: Secondary | ICD-10-CM | POA: Diagnosis not present

## 2017-03-09 DIAGNOSIS — M79641 Pain in right hand: Secondary | ICD-10-CM | POA: Diagnosis not present

## 2017-03-20 DIAGNOSIS — Z125 Encounter for screening for malignant neoplasm of prostate: Secondary | ICD-10-CM | POA: Diagnosis not present

## 2017-03-20 DIAGNOSIS — Z1211 Encounter for screening for malignant neoplasm of colon: Secondary | ICD-10-CM | POA: Diagnosis not present

## 2017-03-20 DIAGNOSIS — Z0000 Encounter for general adult medical examination without abnormal findings: Secondary | ICD-10-CM | POA: Diagnosis not present

## 2017-03-20 DIAGNOSIS — N529 Male erectile dysfunction, unspecified: Secondary | ICD-10-CM | POA: Diagnosis not present

## 2017-03-20 DIAGNOSIS — I10 Essential (primary) hypertension: Secondary | ICD-10-CM | POA: Diagnosis not present

## 2017-03-20 DIAGNOSIS — E7800 Pure hypercholesterolemia, unspecified: Secondary | ICD-10-CM | POA: Diagnosis not present

## 2017-03-20 DIAGNOSIS — H9190 Unspecified hearing loss, unspecified ear: Secondary | ICD-10-CM | POA: Diagnosis not present

## 2017-03-23 DIAGNOSIS — S62610A Displaced fracture of proximal phalanx of right index finger, initial encounter for closed fracture: Secondary | ICD-10-CM | POA: Diagnosis not present

## 2017-03-23 DIAGNOSIS — G8918 Other acute postprocedural pain: Secondary | ICD-10-CM | POA: Diagnosis not present

## 2017-03-23 DIAGNOSIS — M24541 Contracture, right hand: Secondary | ICD-10-CM | POA: Diagnosis not present

## 2017-03-23 DIAGNOSIS — S66529D Laceration of intrinsic muscle, fascia and tendon of unspecified finger at wrist and hand level, subsequent encounter: Secondary | ICD-10-CM | POA: Diagnosis not present

## 2017-03-23 DIAGNOSIS — S66320A Laceration of extensor muscle, fascia and tendon of right index finger at wrist and hand level, initial encounter: Secondary | ICD-10-CM | POA: Diagnosis not present

## 2017-03-26 DIAGNOSIS — Z981 Arthrodesis status: Secondary | ICD-10-CM | POA: Diagnosis not present

## 2017-03-26 DIAGNOSIS — S61209D Unspecified open wound of unspecified finger without damage to nail, subsequent encounter: Secondary | ICD-10-CM | POA: Diagnosis not present

## 2017-03-26 DIAGNOSIS — S66520D Laceration of intrinsic muscle, fascia and tendon of right index finger at wrist and hand level, subsequent encounter: Secondary | ICD-10-CM | POA: Diagnosis not present

## 2017-03-26 DIAGNOSIS — M79641 Pain in right hand: Secondary | ICD-10-CM | POA: Diagnosis not present

## 2017-03-26 DIAGNOSIS — S62610D Displaced fracture of proximal phalanx of right index finger, subsequent encounter for fracture with routine healing: Secondary | ICD-10-CM | POA: Diagnosis not present

## 2017-03-26 DIAGNOSIS — S66529D Laceration of intrinsic muscle, fascia and tendon of unspecified finger at wrist and hand level, subsequent encounter: Secondary | ICD-10-CM | POA: Diagnosis not present

## 2017-03-26 DIAGNOSIS — M25649 Stiffness of unspecified hand, not elsewhere classified: Secondary | ICD-10-CM | POA: Diagnosis not present

## 2017-03-26 DIAGNOSIS — M79644 Pain in right finger(s): Secondary | ICD-10-CM | POA: Diagnosis not present

## 2017-04-02 DIAGNOSIS — Z1211 Encounter for screening for malignant neoplasm of colon: Secondary | ICD-10-CM | POA: Diagnosis not present

## 2017-04-14 DIAGNOSIS — S61200D Unspecified open wound of right index finger without damage to nail, subsequent encounter: Secondary | ICD-10-CM | POA: Diagnosis not present

## 2017-04-14 DIAGNOSIS — S56421D Laceration of extensor muscle, fascia and tendon of right index finger at forearm level, subsequent encounter: Secondary | ICD-10-CM | POA: Diagnosis not present

## 2017-05-05 DIAGNOSIS — S56421D Laceration of extensor muscle, fascia and tendon of right index finger at forearm level, subsequent encounter: Secondary | ICD-10-CM | POA: Diagnosis not present

## 2017-05-05 DIAGNOSIS — Z981 Arthrodesis status: Secondary | ICD-10-CM | POA: Diagnosis not present

## 2017-05-05 DIAGNOSIS — S61200D Unspecified open wound of right index finger without damage to nail, subsequent encounter: Secondary | ICD-10-CM | POA: Diagnosis not present

## 2017-05-06 ENCOUNTER — Encounter: Payer: Self-pay | Admitting: Psychiatry

## 2017-05-06 ENCOUNTER — Ambulatory Visit (INDEPENDENT_AMBULATORY_CARE_PROVIDER_SITE_OTHER): Payer: Medicare HMO | Admitting: Psychiatry

## 2017-05-06 VITALS — BP 161/89 | HR 94 | Temp 97.5°F | Wt 194.8 lb

## 2017-05-06 DIAGNOSIS — F4320 Adjustment disorder, unspecified: Secondary | ICD-10-CM | POA: Diagnosis not present

## 2017-05-06 DIAGNOSIS — Z8659 Personal history of other mental and behavioral disorders: Secondary | ICD-10-CM | POA: Diagnosis not present

## 2017-05-06 DIAGNOSIS — F1211 Cannabis abuse, in remission: Secondary | ICD-10-CM

## 2017-05-06 DIAGNOSIS — G4700 Insomnia, unspecified: Secondary | ICD-10-CM

## 2017-05-06 MED ORDER — TRAZODONE HCL 50 MG PO TABS: 50.0000 mg | ORAL_TABLET | Freq: Every day | ORAL | 1 refills | Status: DC

## 2017-05-06 NOTE — Patient Instructions (Signed)
Trazodone tablets What is this medicine? TRAZODONE (TRAZ oh done) is used to treat depression. This medicine may be used for other purposes; ask your health care provider or pharmacist if you have questions. COMMON BRAND NAME(S): Desyrel What should I tell my health care provider before I take this medicine? They need to know if you have any of these conditions: -attempted suicide or thinking about it -bipolar disorder -bleeding problems -glaucoma -heart disease, or previous heart attack -irregular heart beat -kidney or liver disease -low levels of sodium in the blood -an unusual or allergic reaction to trazodone, other medicines, foods, dyes or preservatives -pregnant or trying to get pregnant -breast-feeding How should I use this medicine? Take this medicine by mouth with a glass of water. Follow the directions on the prescription label. Take this medicine shortly after a meal or a light snack. Take your medicine at regular intervals. Do not take your medicine more often than directed. Do not stop taking this medicine suddenly except upon the advice of your doctor. Stopping this medicine too quickly may cause serious side effects or your condition may worsen. A special MedGuide will be given to you by the pharmacist with each prescription and refill. Be sure to read this information carefully each time. Talk to your pediatrician regarding the use of this medicine in children. Special care may be needed. Overdosage: If you think you have taken too much of this medicine contact a poison control center or emergency room at once. NOTE: This medicine is only for you. Do not share this medicine with others. What if I miss a dose? If you miss a dose, take it as soon as you can. If it is almost time for your next dose, take only that dose. Do not take double or extra doses. What may interact with this medicine? Do not take this medicine with any of the following medications: -certain medicines  for fungal infections like fluconazole, itraconazole, ketoconazole, posaconazole, voriconazole -cisapride -dofetilide -dronedarone -linezolid -MAOIs like Carbex, Eldepryl, Marplan, Nardil, and Parnate -mesoridazine -methylene blue (injected into a vein) -pimozide -saquinavir -thioridazine -ziprasidone This medicine may also interact with the following medications: -alcohol -antiviral medicines for HIV or AIDS -aspirin and aspirin-like medicines -barbiturates like phenobarbital -certain medicines for blood pressure, heart disease, irregular heart beat -certain medicines for depression, anxiety, or psychotic disturbances -certain medicines for migraine headache like almotriptan, eletriptan, frovatriptan, naratriptan, rizatriptan, sumatriptan, zolmitriptan -certain medicines for seizures like carbamazepine and phenytoin -certain medicines for sleep -certain medicines that treat or prevent blood clots like dalteparin, enoxaparin, warfarin -digoxin -fentanyl -lithium -NSAIDS, medicines for pain and inflammation, like ibuprofen or naproxen -other medicines that prolong the QT interval (cause an abnormal heart rhythm) -rasagiline -supplements like St. John's wort, kava kava, valerian -tramadol -tryptophan This list may not describe all possible interactions. Give your health care provider a list of all the medicines, herbs, non-prescription drugs, or dietary supplements you use. Also tell them if you smoke, drink alcohol, or use illegal drugs. Some items may interact with your medicine. What should I watch for while using this medicine? Tell your doctor if your symptoms do not get better or if they get worse. Visit your doctor or health care professional for regular checks on your progress. Because it may take several weeks to see the full effects of this medicine, it is important to continue your treatment as prescribed by your doctor. Patients and their families should watch out for new  or worsening thoughts of suicide or depression. Also   watch out for sudden changes in feelings such as feeling anxious, agitated, panicky, irritable, hostile, aggressive, impulsive, severely restless, overly excited and hyperactive, or not being able to sleep. If this happens, especially at the beginning of treatment or after a change in dose, call your health care professional. You may get drowsy or dizzy. Do not drive, use machinery, or do anything that needs mental alertness until you know how this medicine affects you. Do not stand or sit up quickly, especially if you are an older patient. This reduces the risk of dizzy or fainting spells. Alcohol may interfere with the effect of this medicine. Avoid alcoholic drinks. This medicine may cause dry eyes and blurred vision. If you wear contact lenses you may feel some discomfort. Lubricating drops may help. See your eye doctor if the problem does not go away or is severe. Your mouth may get dry. Chewing sugarless gum, sucking hard candy and drinking plenty of water may help. Contact your doctor if the problem does not go away or is severe. What side effects may I notice from receiving this medicine? Side effects that you should report to your doctor or health care professional as soon as possible: -allergic reactions like skin rash, itching or hives, swelling of the face, lips, or tongue -elevated mood, decreased need for sleep, racing thoughts, impulsive behavior -confusion -fast, irregular heartbeat -feeling faint or lightheaded, falls -feeling agitated, angry, or irritable -loss of balance or coordination -painful or prolonged erections -restlessness, pacing, inability to keep still -suicidal thoughts or other mood changes -tremors -trouble sleeping -seizures -unusual bleeding or bruising Side effects that usually do not require medical attention (report to your doctor or health care professional if they continue or are bothersome): -change in  sex drive or performance -change in appetite or weight -constipation -headache -muscle aches or pains -nausea This list may not describe all possible side effects. Call your doctor for medical advice about side effects. You may report side effects to FDA at 1-800-FDA-1088. Where should I keep my medicine? Keep out of the reach of children. Store at room temperature between 15 and 30 degrees C (59 to 86 degrees F). Protect from light. Keep container tightly closed. Throw away any unused medicine after the expiration date. NOTE: This sheet is a summary. It may not cover all possible information. If you have questions about this medicine, talk to your doctor, pharmacist, or health care provider.  2018 Elsevier/Gold Standard (2015-11-29 16:57:05)  

## 2017-05-06 NOTE — Progress Notes (Addendum)
Psychiatric Initial Adult Assessment   Patient Identification: Antonio Cisneros MRN:  [id number] Date of Evaluation:  05/06/2017 Referral Source:Dean Clovis Riley MD Chief Complaint:  " I do not know ."  Chief Complaint    Establish Care; Post-Traumatic Stress Disorder     Visit Diagnosis:    ICD-10-CM   1. Insomnia, unspecified type G47.00 traZODone (DESYREL) 50 MG tablet  2. Adjustment disorder, unspecified type F43.20   3. History of posttraumatic stress disorder (PTSD) Z86.59   4. Cannabis abuse, in remission F12.11     History of Present Illness: Antonio Cisneros is a 66 year old Caucasian male who is married, retired, lives in [place] , has a history of PTSD, presented to the clinic here today to establish care.  Arhum reports that he is here here today because his wife felt like he needed some kind of help.  Timmie reports that he does have periods when he feels a little bit depressed or anxious.  However he denies any sadness, lack of appetite, hopelessness ,guilt etc. He does have some trouble focusing sometimes.  He also reports sleep issues.  He reports that he sleeps only 4 hours per night.  Patient reports that he has tried over-the-counter medications but nothing has really helped.  He does report that he sometimes think about not wanting to be alive.  However he reports that it is nothing acute and that he has struggled with such thoughts all his life.  The last time he had those thoughts was a few weeks ago.  He denies any active suicidal ideation or plan.  He denies any suicide attempts.  Antonio Cisneros denies any anxiety attacks or panic attacks.  Antonio Cisneros denies any perceptual disturbances.  Antonio Cisneros denies any manic symptoms.  Antonio Cisneros reports a history of PTSD.  He reports he was in the [employer] in [place] several years ago.  Patient however currently denies any flashbacks nightmares and intrusive memories or paranoia.  Patient denies any trauma.  Patient denies any history of sexual or  physical abuse.  Antonio Cisneros reports a history of cannabis abuse 15 years ago.  He quit using it and has been clean ever since.  Antonio Cisneros reports that he comes from a broken home.  His mom left him and he was 42 years old, however his dad and his grandparents raised him.  He reports that it was a struggle growing up without a mom.  However when he was 33 he was drafted to the [employer].  Patient reports that after he started working in [employer] he forgot all about his struggles and had new things to worry about.  He served in [employer] for 2 years and returned.  He was married once, the marriage did not go well.  He reports he got a divorce.  His son from that marriage continues to be supportive and he lives close by.  Patient reports that his current wife and him started having some issues recently ever since he got retired.  He reports that he currently lives in a home with a lot of property all around it and has cows ,chickens, garden and has a lot of work that he can do on a daily basis. But he says that his wife wants him to go back and get a job.  Financially, he reports he does not have any problems at this time , his wife works as an Print production planner and she is doing a good job.  Patient however reports that his wife continues to think he needs  to go back to work and he struggles with that.  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  Past Psychiatric History: Hx of PTSD in the past. Denies suicide attempts.Denies IP admissions.  Previous Psychotropic Medications: Yes - has tried OTC medications  Substance Abuse History in the last 12 months:  No.  Consequences of Substance Abuse: Negative  Past Medical History:  Past Medical History:  Diagnosis Date  . Depression   . PTSD (post-traumatic stress disorder)      Past Surgical History:  Procedure Laterality Date  . ANKLE FRACTURE SURGERY    . broken finger    . SINUSOTOMY      Family Psychiatric History: A cousin committed suicide, he does not know details. His mother used to abuse alcohol.  Family History:  Family History  Problem Relation Age of Onset  . Alcohol abuse Mother   . Dementia Father     Social History:   Social History   Social History  . Marital status: Married    Spouse name: N/A  . Number of children: N/A  . Years of education: N/A   Social History Main Topics  . Smoking status: Former Smoker    Quit date: 1989  . Smokeless tobacco: Never Used  . Alcohol use No  . Drug use: No  . Sexual activity: Not Currently   Other Topics Concern  . None   Social History Narrative  . None    Additional Social History: Raised by his dad and grand parents after his mother left them. He was in the army in the past , for 2 yrs . He is divorced once, married now, retired.   Allergies:  No Known Allergies  Metabolic Disorder Labs: No results found for: HGBA1C, MPG No results found for: PROLACTIN No results found for: CHOL, TRIG, HDL, CHOLHDL, VLDL, LDLCALC   Current Medications: Current Outpatient Prescriptions  Medication Sig Dispense Refill  . metoprolol succinate (TOPROL-XL) 50 MG 24 hr tablet     . rosuvastatin (CRESTOR) 10 MG tablet     . traZODone (DESYREL) 50 MG tablet Take 1 tablet (50 mg total) by mouth at bedtime. 30 tablet 1   No current facility-administered medications for this visit.     Neurologic: Headache: No Seizure: No Paresthesias:No  Musculoskeletal: Strength & Muscle Tone: within normal limits Gait & Station: normal Patient leans: N/A  Psychiatric Specialty Exam: Review of Systems  Psychiatric/Behavioral: The patient is nervous/anxious and has insomnia.   All other systems reviewed and are negative.   Blood pressure (!) 161/89, pulse 94, temperature (!) 97.5 F (36.4 C),  temperature source Oral, weight 194 lb 12.8 oz (88.4 kg).Body mass index is 28.77 kg/m.  General Appearance: Casual  Eye Contact:  Fair  Speech:  Normal Rate  Volume:  Normal  Mood:  Anxious  Affect:  Appropriate  Thought Process:  Goal Directed and Descriptions of Associations: Intact  Orientation:  Full (Time, Place, and Person)  Thought Content:  Logical  Suicidal Thoughts:  No  Homicidal Thoughts:  No  Memory:  Immediate;   Fair Recent;   Fair Remote;   Fair  Judgement:  Fair  Insight:  Fair  Psychomotor Activity:  Normal  Concentration:  Concentration: Fair and Attention Span: Fair  Recall:  [employer] of Knowledge:Fair  Language: Fair  Akathisia:  No  Handed:  Right  AIMS (if indicated): NA  Assets:  Communication Skills Desire for Improvement Social Support  ADL's:  Intact  Cognition: WNL  Sleep:  Poor    Treatment Plan Summary: Casimiro NeedleMichael is a 66 year old Caucasian male, who has a history of PTSD, who is married currently retired who presented to the clinic today to establish care.  Casimiro NeedleMichael today reports that he does not have any significant sadness or anxiety, he does struggle with insomnia.  He does report that he is having some relational issues with his wife.  He is agreeable to start medications for sleep, but reports he does not need anything for his mood symptoms.  Casimiro NeedleMichael denies any substance abuse problems.  Casimiro NeedleMichael currently does not have any PTSD symptoms.  Casimiro NeedleMichael does have positive family history of dementia in his father as well as suicide in a cousin.  Currently is a good candidate for outpatient treatment.  Plan as noted below Medication management and Plan see below  Plan For insomnia Start trazodone 50 mg p.o. nightly. Provided medication education, provided handouts.  For anxiety symptoms GAD 7 = 2 Refer for psychotherapy. He declines medications.  For depressive sx: PHQ 9 = 10. Refer for therapy. He declines medications.  For cannabis abuse  in remission: He quit 15 yrs ago.  For relational issues Refer for family therapy.  Discussed crisis plan , provided crisis hotline numbers, national suicide hotline.  Discussed his elevated blood pressure, he reports he skips his medication sometimes.  Discussed to take his medication every day, follow-up with his primary medical doctor.  Follow-up in 4 weeks.   More than 50 % of the time was spent for psychoeducation and supportive psychotherapy and care coordination.   This note was generated in part or whole with voice recognition software. Voice recognition is usually quite accurate but there are transcription errors that can and very often do occur. I apologize for any typographical errors that were not detected and corrected.     Jomarie LongsSaramma Inmer Nix, MD 10/24/20183:00 PM

## 2017-05-08 DIAGNOSIS — R42 Dizziness and giddiness: Secondary | ICD-10-CM | POA: Diagnosis not present

## 2017-05-08 DIAGNOSIS — Z23 Encounter for immunization: Secondary | ICD-10-CM | POA: Diagnosis not present

## 2017-05-08 DIAGNOSIS — R279 Unspecified lack of coordination: Secondary | ICD-10-CM | POA: Diagnosis not present

## 2017-05-14 ENCOUNTER — Other Ambulatory Visit: Payer: Self-pay | Admitting: Pharmacy Technician

## 2017-05-14 ENCOUNTER — Other Ambulatory Visit: Payer: Self-pay | Admitting: Family Medicine

## 2017-05-14 DIAGNOSIS — R42 Dizziness and giddiness: Secondary | ICD-10-CM

## 2017-05-14 NOTE — Patient Outreach (Signed)
[facility] Care Management  05/14/2017  Antonio Cisneros 1950/10/04 [id number]  Contacted patient in regards to Rosuvastatin 10mg  medication adherence. No answer, left HIPAA appropriate voicemail  Suzan Slickshley N. Ernesta Ambleoleman, CPhT [facility] Care Management [phone]

## 2017-05-19 DIAGNOSIS — R42 Dizziness and giddiness: Secondary | ICD-10-CM | POA: Diagnosis not present

## 2017-05-28 DIAGNOSIS — R42 Dizziness and giddiness: Secondary | ICD-10-CM | POA: Diagnosis not present

## 2017-06-02 DIAGNOSIS — Z4789 Encounter for other orthopedic aftercare: Secondary | ICD-10-CM | POA: Diagnosis not present

## 2017-06-03 ENCOUNTER — Ambulatory Visit
Admission: RE | Admit: 2017-06-03 | Discharge: 2017-06-03 | Disposition: A | Discharge status: Home / Self Care | Payer: Medicare HMO | Source: Ambulatory Visit | Attending: Family Medicine | Admitting: Family Medicine

## 2017-06-03 DIAGNOSIS — R42 Dizziness and giddiness: Secondary | ICD-10-CM | POA: Diagnosis not present

## 2017-07-17 DIAGNOSIS — F411 Generalized anxiety disorder: Secondary | ICD-10-CM | POA: Diagnosis not present

## 2017-07-17 DIAGNOSIS — F331 Major depressive disorder, recurrent, moderate: Secondary | ICD-10-CM | POA: Diagnosis not present

## 2017-08-04 DIAGNOSIS — F4323 Adjustment disorder with mixed anxiety and depressed mood: Secondary | ICD-10-CM | POA: Diagnosis not present

## 2017-09-01 DIAGNOSIS — F411 Generalized anxiety disorder: Secondary | ICD-10-CM | POA: Diagnosis not present

## 2017-09-01 DIAGNOSIS — F331 Major depressive disorder, recurrent, moderate: Secondary | ICD-10-CM | POA: Diagnosis not present

## 2017-09-01 DIAGNOSIS — F4323 Adjustment disorder with mixed anxiety and depressed mood: Secondary | ICD-10-CM | POA: Diagnosis not present

## 2017-09-17 DIAGNOSIS — M545 Low back pain: Secondary | ICD-10-CM | POA: Diagnosis not present

## 2017-09-17 DIAGNOSIS — J309 Allergic rhinitis, unspecified: Secondary | ICD-10-CM | POA: Diagnosis not present

## 2017-09-17 DIAGNOSIS — R42 Dizziness and giddiness: Secondary | ICD-10-CM | POA: Diagnosis not present

## 2017-09-17 DIAGNOSIS — E7800 Pure hypercholesterolemia, unspecified: Secondary | ICD-10-CM | POA: Diagnosis not present

## 2017-09-17 DIAGNOSIS — I10 Essential (primary) hypertension: Secondary | ICD-10-CM | POA: Diagnosis not present

## 2017-09-18 ENCOUNTER — Encounter: Payer: Self-pay | Admitting: Neurology

## 2017-09-25 ENCOUNTER — Ambulatory Visit (INDEPENDENT_AMBULATORY_CARE_PROVIDER_SITE_OTHER): Payer: Medicare HMO | Admitting: Neurology

## 2017-09-25 ENCOUNTER — Encounter: Payer: Self-pay | Admitting: Neurology

## 2017-09-25 VITALS — BP 130/60 | HR 70 | Resp 16 | Ht 69.0 in | Wt 198.4 lb

## 2017-09-25 DIAGNOSIS — R42 Dizziness and giddiness: Secondary | ICD-10-CM

## 2017-09-25 NOTE — Patient Instructions (Addendum)
Cause of dizziness is unclear.  MRI of brain does not reveal any specific cause for dizziness.  I would like to check CTA of head to evaluate for any blood vessel narrowing.  We have sent a referral to [facility] for your MRI and they will call you directly to schedule your appt. They are [address] at [address] If you need to contact them directly please call [phone].  Follow up as needed regarding CT of head

## 2017-09-25 NOTE — Progress Notes (Signed)
NEUROLOGY CONSULTATION NOTE  Antonio Cisneros MRN: [id number] DOB: 1950-10-07  Referring provider: Dr. Clovis RileyMitchell Primary care provider: Dr. Clovis RileyMitchell  Reason for consult:  dizziness  HISTORY OF PRESENT ILLNESS: Antonio MillinMichael F Rembert is a 67 year old male with PTSD who presents for evaluation of dizziness.  History supplemented by PCP note.  Onset of his symptoms started 2 years ago.  It is unchanged.  There was no preceding event.  He did have a fall off of a ladder but did not hit his head.  He describes dizziness but it is difficult for him to elaborate.  It is not a spinning sensation and it is not a lightheadedness as if he thinks he will pass out.  When he sits still, he feels fine.  It is triggered when he stands up or looks up and down.  It may be brief or it can last all day.  He feels unsteady on his feet.  Although he stumbles he has not had any falls.  He denies nausea and vomiting.  He denies hearing loss but notes occasional ringing in the ears, which is not new.  Sometimes he notes mild vertical diplopia that disappears when closing either eye.  He denies unilateral numbness or weakness.  He has been evaluated and treated by ENT about a year ago for sinusitis.  MRI of brain without contrast from 06/03/17 was personally reviewed and revealed extensive acute on chronic sinus disease involving the ethmoid air cells, right frontal sinuses, and bilateral maxillary and sphenoid sinuses.  MRI of brain without contrast from 06/03/17 was personally reviewed and revealed no abnormality of the brain to explain symptoms, however it still demonstrated extensive acute on chronic sinus disease involving the ethmoid air cells, right frontal sinuses, and bilateral maxillary and sphenoid sinuses.  He states that his eyes have been evaluated.  When he lays down at night, it sometimes feels like his heart skips a beat.  PAST MEDICAL HISTORY: Past Medical History:  Diagnosis Date  . Depression   . PTSD  (post-traumatic stress disorder)     PAST SURGICAL HISTORY: Past Surgical History:  Procedure Laterality Date  . ANKLE FRACTURE SURGERY    . broken finger    . SINUSOTOMY      MEDICATIONS: Current Outpatient Medications on File Prior to Visit  Medication Sig Dispense Refill  . diclofenac (VOLTAREN) 75 MG EC tablet TAKE 1 TABLET BY MOUTH TWICE DAILY AS NEEDED FOR PAIN WITH FOOD OR MILK  5  . metoprolol succinate (TOPROL-XL) 50 MG 24 hr tablet     . traZODone (DESYREL) 50 MG tablet Take 1 tablet (50 mg total) by mouth at bedtime. (Patient taking differently: Take 50 mg by mouth at bedtime as needed. ) 30 tablet 1   No current facility-administered medications on file prior to visit.     ALLERGIES: No Known Allergies  FAMILY HISTORY: Family History  Problem Relation Age of Onset  . Alcohol abuse Mother   . Dementia Father     SOCIAL HISTORY: Social History   Socioeconomic History  . Marital status: Married    Spouse name: Not on file  . Number of children: Not on file  . Years of education: Not on file  . Highest education level: Not on file  Social Needs  . Financial resource strain: Not on file  . Food insecurity - worry: Not on file  . Food insecurity - inability: Not on file  . Transportation needs - medical: Not on  file  . Transportation needs - non-medical: Not on file  Occupational History  . Not on file  Tobacco Use  . Smoking status: Former Smoker    Packs/day: 1.50    Years: 25.00    Pack years: 37.50    Types: Cigarettes    Last attempt to quit: 1989    Years since quitting: 30.2  . Smokeless tobacco: Never Used  Substance and Sexual Activity  . Alcohol use: No  . Drug use: No  . Sexual activity: Not Currently  Other Topics Concern  . Not on file  Social History Narrative  . Not on file    REVIEW OF SYSTEMS: Constitutional: No fevers, chills, or sweats, no generalized fatigue, change in appetite Eyes: No visual changes, double vision, eye  pain Ear, nose and throat: No hearing loss, ear pain, nasal congestion, sore throat Cardiovascular: No chest pain, palpitations Respiratory:  No shortness of breath at rest or with exertion, wheezes GastrointestinaI: No nausea, vomiting, diarrhea, abdominal pain, fecal incontinence Genitourinary:  No dysuria, urinary retention or frequency Musculoskeletal:  No neck pain, back pain Integumentary: No rash, pruritus, skin lesions Neurological: as above Psychiatric: No depression, insomnia, anxiety Endocrine: No palpitations, fatigue, diaphoresis, mood swings, change in appetite, change in weight, increased thirst Hematologic/Lymphatic:  No purpura, petechiae. Allergic/Immunologic: no itchy/runny eyes, nasal congestion, recent allergic reactions, rashes  PHYSICAL EXAM: Vitals:   09/25/17 0851  BP: 130/60  Pulse: 70  Resp: 16  SpO2: 97%   General: No acute distress.  Patient appears well-groomed.  Head:  Normocephalic/atraumatic Eyes:  fundi examined but not visualized Neck: supple, no paraspinal tenderness, full range of motion Back: No paraspinal tenderness Heart: regular rate and rhythm Lungs: Clear to auscultation bilaterally. Vascular: No carotid bruits. Neurological Exam: Mental status: alert and oriented to person, place, and time, recent and remote memory intact, fund of knowledge intact, attention and concentration intact, speech fluent and not dysarthric, language intact. Cranial nerves: CN I: not tested CN II: pupils equal, round and reactive to light, visual fields intact CN III, IV, VI:  full range of motion, no ptosis.  Saccadic movements left and right on tracking.  Positive Head Impulse Test to the right, equivocal to the left. CN V: facial sensation intact CN VII: upper and lower face symmetric CN VIII: hearing intact CN IX, X: gag intact, uvula midline CN XI: sternocleidomastoid and trapezius muscles intact CN XII: tongue midline Bulk & Tone: normal, no  fasciculations. Motor:  5/5 throughout  Sensation:  Pinprick and vibration sensation intact. Deep Tendon Reflexes:  2+ throughout, toes downgoing. Finger to nose testing:  Without dysmetria.  Heel to shin:  Without dysmetria.  Gait:  Normal station and stride.  Able to turn and tandem walk but was briefly unsteady when he stopped. Romberg negative.  IMPRESSION: Disequibrium.  Unclear etiology.  MRI of brain does not reveal central cause.  Orthostatic vitals are normal.  Positive Head Impulse Test suggests peripheral etiology.  He still exhibits sinus disease, which may be contributing.    PLAN: 1.  We will check CTA of head to evaluated vertebrobasilar system 2.  Ultimately, treatment would be symptomatic.  Consider referral to vestibular rehab 3.  Consider cardiac evaluation regarding sensation of his hear "skipping a beat"  Thank you for allowing me to take part in the care of this patient.  Shon Millet, DO  CC:  Lupe Carney, MD

## 2017-10-10 ENCOUNTER — Ambulatory Visit
Admission: RE | Admit: 2017-10-10 | Discharge: 2017-10-10 | Disposition: A | Discharge status: Home / Self Care | Payer: Medicare HMO | Source: Ambulatory Visit | Attending: Neurology | Admitting: Neurology

## 2017-10-10 DIAGNOSIS — R42 Dizziness and giddiness: Secondary | ICD-10-CM | POA: Diagnosis not present

## 2017-10-10 MED ORDER — IOPAMIDOL (ISOVUE-300) INJECTION 61%
75.0000 mL | Freq: Once | INTRAVENOUS | Status: AC | PRN
  Administered 2017-10-10: 75 mL via INTRAVENOUS

## 2017-10-12 ENCOUNTER — Telehealth: Payer: Self-pay

## 2017-10-12 DIAGNOSIS — R42 Dizziness and giddiness: Secondary | ICD-10-CM

## 2017-10-12 NOTE — Telephone Encounter (Signed)
-----   Message from Drema DallasAdam R Jaffe, DO sent at 10/12/2017  6:47 AM EDT ----- CT of head looks okay, however I wanted to order CTA of head to look at the blood vessels (for disequilibrium/rule out vertebrobasilar insufficiency).  We will need to reschedule this.

## 2017-10-12 NOTE — Telephone Encounter (Signed)
Called Pt, LM on VM advising him of CT results and of CTA ordered, [facility] to contact him to schedule.  Advsd Pt to call with any questions.

## 2017-10-19 ENCOUNTER — Telehealth: Payer: Self-pay | Admitting: Neurology

## 2017-10-19 NOTE — Telephone Encounter (Signed)
Antonio Cisneros left a voicemail message asking if the cat scan results were available CB# [phone]

## 2017-10-20 NOTE — Telephone Encounter (Signed)
Called and spoke with Celine MansSonja, advsd her I had called on 10/12/17 and left results and that CTA order had been placed on VM. Gave her results and contact info for [facility].

## 2017-10-26 NOTE — Progress Notes (Addendum)
Rcvd call from [facility] [phone]) c/o [employer], spoke with Hilda LiasMarie. She stated since had CT of head 10/10/17, it is too soon to get CTA, will need to wait 2 weeks for the safety of the patient. [facility] tracking [phone]

## 2017-10-27 NOTE — Progress Notes (Signed)
Called [facility], spoke with [place]Brianna. R/S appt to Friday 11/13/17 at 1pm. Called Pt, advsd him why we had to change, gave him new date and time, asked her arrive by 12:40p, no solid foods 4 hrs prior to appt.  Sent staff message to [facility] to check authorization.

## 2017-11-04 ENCOUNTER — Other Ambulatory Visit: Payer: Medicare HMO

## 2017-11-13 ENCOUNTER — Ambulatory Visit
Admission: RE | Admit: 2017-11-13 | Discharge: 2017-11-13 | Disposition: A | Discharge status: Home / Self Care | Payer: Medicare HMO | Source: Ambulatory Visit | Attending: Neurology | Admitting: Neurology

## 2017-11-13 DIAGNOSIS — F3342 Major depressive disorder, recurrent, in full remission: Secondary | ICD-10-CM | POA: Diagnosis not present

## 2017-11-13 DIAGNOSIS — R42 Dizziness and giddiness: Secondary | ICD-10-CM

## 2017-11-13 DIAGNOSIS — H8193 Unspecified disorder of vestibular function, bilateral: Secondary | ICD-10-CM | POA: Diagnosis not present

## 2017-11-13 DIAGNOSIS — F411 Generalized anxiety disorder: Secondary | ICD-10-CM | POA: Diagnosis not present

## 2017-11-13 MED ORDER — IOPAMIDOL (ISOVUE-370) INJECTION 76%
75.0000 mL | Freq: Once | INTRAVENOUS | Status: AC | PRN
  Administered 2017-11-13: 75 mL via INTRAVENOUS

## 2017-11-16 ENCOUNTER — Telehealth: Payer: Self-pay | Admitting: Neurology

## 2017-11-16 ENCOUNTER — Telehealth: Payer: Self-pay

## 2017-11-16 NOTE — Telephone Encounter (Signed)
Called and LMOVM advising Pt CTA was unremarkable

## 2017-11-16 NOTE — Telephone Encounter (Signed)
-----   Message from Drema Dallas, DO sent at 11/16/2017  8:50 AM EDT ----- CT of arteries in head look unremarkable.  No cause for dizziness.

## 2017-11-16 NOTE — Telephone Encounter (Signed)
Pt left a VM message regarding a MRI but left no other detail

## 2017-11-17 NOTE — Telephone Encounter (Signed)
Called Pt, LMOVM. I have tried to reach Pt several times about his CTA, which is unremarkable, nothing seen to cause dizziness.

## 2017-11-17 NOTE — Telephone Encounter (Signed)
Patient wants to talk to some one about the scan he had done

## 2017-11-18 NOTE — Telephone Encounter (Signed)
Rcvd an after hours call notification. Pt called at 6:28pm from [phone], the same number I have been unable to reach him at. Pt states he is dizzy and has been all day.He was instructed to go to the ED. Pt refused, did not want to go anywhere, said would call the next day. Am still unable to reach Pt.

## 2017-11-26 DIAGNOSIS — H524 Presbyopia: Secondary | ICD-10-CM | POA: Diagnosis not present

## 2017-12-30 ENCOUNTER — Ambulatory Visit
Admission: RE | Admit: 2017-12-30 | Discharge: 2017-12-30 | Disposition: A | Discharge status: Home / Self Care | Payer: Medicare HMO | Source: Ambulatory Visit | Attending: Family Medicine | Admitting: Family Medicine

## 2017-12-30 ENCOUNTER — Other Ambulatory Visit: Payer: Self-pay | Admitting: Family Medicine

## 2017-12-30 DIAGNOSIS — R059 Cough, unspecified: Secondary | ICD-10-CM

## 2017-12-30 DIAGNOSIS — Z9181 History of falling: Secondary | ICD-10-CM

## 2017-12-30 DIAGNOSIS — R079 Chest pain, unspecified: Secondary | ICD-10-CM

## 2017-12-30 DIAGNOSIS — R0789 Other chest pain: Secondary | ICD-10-CM | POA: Diagnosis not present

## 2017-12-30 DIAGNOSIS — R05 Cough: Secondary | ICD-10-CM

## 2017-12-30 DIAGNOSIS — R296 Repeated falls: Secondary | ICD-10-CM | POA: Diagnosis not present

## 2018-01-04 ENCOUNTER — Ambulatory Visit: Payer: Medicare HMO | Admitting: Neurology

## 2018-01-05 ENCOUNTER — Ambulatory Visit: Payer: Medicare HMO | Admitting: Neurology

## 2018-01-05 ENCOUNTER — Encounter

## 2018-02-15 DIAGNOSIS — I10 Essential (primary) hypertension: Secondary | ICD-10-CM | POA: Diagnosis not present

## 2018-02-15 DIAGNOSIS — J3489 Other specified disorders of nose and nasal sinuses: Secondary | ICD-10-CM | POA: Diagnosis not present

## 2018-02-15 DIAGNOSIS — J309 Allergic rhinitis, unspecified: Secondary | ICD-10-CM | POA: Diagnosis not present

## 2018-02-22 DIAGNOSIS — F4321 Adjustment disorder with depressed mood: Secondary | ICD-10-CM | POA: Diagnosis not present

## 2018-02-22 DIAGNOSIS — E119 Type 2 diabetes mellitus without complications: Secondary | ICD-10-CM | POA: Diagnosis not present

## 2018-02-22 DIAGNOSIS — E7800 Pure hypercholesterolemia, unspecified: Secondary | ICD-10-CM | POA: Diagnosis not present

## 2018-02-22 DIAGNOSIS — I10 Essential (primary) hypertension: Secondary | ICD-10-CM | POA: Diagnosis not present

## 2018-02-22 DIAGNOSIS — M1990 Unspecified osteoarthritis, unspecified site: Secondary | ICD-10-CM | POA: Diagnosis not present

## 2018-02-22 DIAGNOSIS — R5383 Other fatigue: Secondary | ICD-10-CM | POA: Diagnosis not present

## 2018-02-22 DIAGNOSIS — E039 Hypothyroidism, unspecified: Secondary | ICD-10-CM | POA: Diagnosis not present

## 2018-03-05 DIAGNOSIS — R42 Dizziness and giddiness: Secondary | ICD-10-CM | POA: Diagnosis not present

## 2018-03-05 DIAGNOSIS — R5383 Other fatigue: Secondary | ICD-10-CM | POA: Diagnosis not present

## 2018-03-05 DIAGNOSIS — N419 Inflammatory disease of prostate, unspecified: Secondary | ICD-10-CM | POA: Diagnosis not present

## 2018-03-24 DIAGNOSIS — M542 Cervicalgia: Secondary | ICD-10-CM | POA: Diagnosis not present

## 2018-03-24 DIAGNOSIS — I10 Essential (primary) hypertension: Secondary | ICD-10-CM | POA: Diagnosis not present

## 2018-03-24 DIAGNOSIS — R42 Dizziness and giddiness: Secondary | ICD-10-CM | POA: Diagnosis not present

## 2018-03-24 DIAGNOSIS — H8113 Benign paroxysmal vertigo, bilateral: Secondary | ICD-10-CM | POA: Diagnosis not present

## 2018-03-26 DIAGNOSIS — M542 Cervicalgia: Secondary | ICD-10-CM | POA: Diagnosis not present

## 2018-03-26 DIAGNOSIS — M2578 Osteophyte, vertebrae: Secondary | ICD-10-CM | POA: Diagnosis not present

## 2018-03-26 DIAGNOSIS — R0602 Shortness of breath: Secondary | ICD-10-CM | POA: Diagnosis not present

## 2018-03-26 DIAGNOSIS — R600 Localized edema: Secondary | ICD-10-CM | POA: Diagnosis not present

## 2018-03-26 DIAGNOSIS — I6501 Occlusion and stenosis of right vertebral artery: Secondary | ICD-10-CM | POA: Diagnosis not present

## 2018-03-26 DIAGNOSIS — M5030 Other cervical disc degeneration, unspecified cervical region: Secondary | ICD-10-CM | POA: Diagnosis not present

## 2018-03-26 DIAGNOSIS — R42 Dizziness and giddiness: Secondary | ICD-10-CM | POA: Diagnosis not present

## 2018-04-06 DIAGNOSIS — M542 Cervicalgia: Secondary | ICD-10-CM | POA: Diagnosis not present

## 2018-04-06 DIAGNOSIS — M47892 Other spondylosis, cervical region: Secondary | ICD-10-CM | POA: Diagnosis not present

## 2018-04-06 DIAGNOSIS — M5481 Occipital neuralgia: Secondary | ICD-10-CM | POA: Diagnosis not present

## 2018-04-06 DIAGNOSIS — M5030 Other cervical disc degeneration, unspecified cervical region: Secondary | ICD-10-CM | POA: Diagnosis not present

## 2018-04-12 ENCOUNTER — Telehealth: Payer: Self-pay

## 2018-04-12 NOTE — Telephone Encounter (Signed)
SENT REFERRAL TO SCHEDULING AND FILED NOTES 

## 2018-04-15 ENCOUNTER — Encounter (INDEPENDENT_AMBULATORY_CARE_PROVIDER_SITE_OTHER): Payer: Self-pay

## 2018-04-15 ENCOUNTER — Encounter: Payer: Self-pay | Admitting: Cardiology

## 2018-04-15 ENCOUNTER — Ambulatory Visit: Payer: Medicare HMO | Admitting: Cardiology

## 2018-04-15 ENCOUNTER — Other Ambulatory Visit: Payer: Self-pay | Admitting: Rehabilitation

## 2018-04-15 VITALS — BP 116/66 | HR 104 | Ht 69.0 in | Wt 194.2 lb

## 2018-04-15 DIAGNOSIS — I6523 Occlusion and stenosis of bilateral carotid arteries: Secondary | ICD-10-CM

## 2018-04-15 DIAGNOSIS — M542 Cervicalgia: Secondary | ICD-10-CM

## 2018-04-15 DIAGNOSIS — R42 Dizziness and giddiness: Secondary | ICD-10-CM

## 2018-04-15 MED ORDER — ASPIRIN EC 81 MG PO TBEC: 81.0000 mg | DELAYED_RELEASE_TABLET | Freq: Every day | ORAL | 3 refills | Status: AC

## 2018-04-15 NOTE — Patient Instructions (Addendum)
Medication Instructions:  The current medical regimen is effective;  continue present plan and medications.  Testing/Procedures: Your physician has requested that you have a carotid duplex in 1 year. This test is an ultrasound of the carotid arteries in your neck. It looks at blood flow through these arteries that supply the brain with blood. Allow one hour for this exam. There are no restrictions or special instructions.  You have been referred to [facility] - Dr Lurena Joiner Tat - for evaluation of dizziness.  Follow-Up: Follow up with Dr Anne Fu as needed.  If you need a refill on your cardiac medications before your next appointment, please call your pharmacy.  Please call back with the name and dose of your cholesterol medication.

## 2018-04-15 NOTE — Progress Notes (Addendum)
Cardiology Office Note:    Date:  04/15/2018   ID:  Antonio Cisneros, DOB 1950-10-13, MRN [id number]  PCP:  Marin Comment, FNP  Cardiologist:  No primary care provider on file.  Electrophysiologist:  None   Referring MD: Clovis Riley, L.August Saucer, MD     History of Present Illness:    Antonio Cisneros is a 67 y.o. male here for the evaluation of abnormal carotid ultrasound at the request Marin Comment, FNP  Ultrasound demonstrated plaque in right and left carotid arteries, left carotid with stenosis.  Right internal carotid artery was less than 50%, homogeneous plaque.  Right vertebral artery is occluded.  Left internal carotid less than 50%, left external carotid 50 to 69%, left common carotid 30 to 49% stenosis.  Echocardiogram read by Harland Dingwall, MD in [facility] showed preserved LV function trivial aortic regurgitation  EKG shows sinus rhythm 100.  Today's EKG is similar.  No other abnormality's.  Overall his main complaint has been chronic dizziness.  He is dizzy all of the time.  States that he did have sinus surgery in the past and in his words since then he has been having issues.  He also suffered a major fall about 4 years ago, thought he was dead but he was able to get up.  Denies any chest pain fevers chills nausea vomiting syncope bleeding orthopnea.  No tremors.  He does tend to stare.  He states that he is dizzy pretty much all the time, cross side, sees double vision.  When I held my finger up in front of them he said he saw 2 of them.    Past Medical History:  Diagnosis Date  . Depression   . PTSD (post-traumatic stress disorder)     Past Surgical History:  Procedure Laterality Date  . ANKLE FRACTURE SURGERY    . broken finger    . SINUSOTOMY      Current Medications: Current Meds  Medication Sig  . diclofenac (VOLTAREN) 75 MG EC tablet TAKE 1 TABLET BY MOUTH TWICE DAILY AS NEEDED FOR PAIN WITH FOOD OR MILK  . metoprolol succinate (TOPROL-XL) 50 MG  24 hr tablet Take 50 mg by mouth daily.      Allergies:   Patient has no known allergies.   Social History   Socioeconomic History  . Marital status: Married    Spouse name: Not on file  . Number of children: Not on file  . Years of education: Not on file  . Highest education level: Not on file  Occupational History  . Not on file  Social Needs  . Financial resource strain: Not on file  . Food insecurity:    Worry: Not on file    Inability: Not on file  . Transportation needs:    Medical: Not on file    Non-medical: Not on file  Tobacco Use  . Smoking status: Former Smoker    Packs/day: 1.50    Years: 25.00    Pack years: 37.50    Types: Cigarettes    Last attempt to quit: 1989    Years since quitting: 30.7  . Smokeless tobacco: Never Used  Substance and Sexual Activity  . Alcohol use: No  . Drug use: No  . Sexual activity: Not Currently  Lifestyle  . Physical activity:    Days per week: Not on file    Minutes per session: Not on file  . Stress: Not on file  Relationships  . Social connections:  Talks on phone: Not on file    Gets together: Not on file    Attends religious service: Not on file    Active member of club or organization: Not on file    Attends meetings of clubs or organizations: Not on file    Relationship status: Not on file  Other Topics Concern  . Not on file  Social History Narrative  . Not on file     Family History: The patient's family history includes Alcohol abuse in his mother; Dementia in his father.  ROS:   Please see the history of present illness.     All other systems reviewed and are negative.  EKGs/Labs/Other Studies Reviewed:    The following studies were reviewed today: Prior office notes, carotid Dopplers, echocardiogram from [facility], EKG reviewed  EKG:  EKG is  ordered today.  The ekg ordered today demonstrates sinus tachycardia rate 104 with no other abnormalities.  Recent Labs: No results found for requested  labs within last 8760 hours.  Recent Lipid Panel No results found for: CHOL, TRIG, HDL, CHOLHDL, VLDL, LDLCALC, LDLDIRECT  Physical Exam:    VS:  BP 116/66   Pulse (!) 104   Ht 5\' 9"  (1.753 m)   Wt 194 lb 3.2 oz (88.1 kg)   SpO2 95%   BMI 28.68 kg/m     Wt Readings from Last 3 Encounters:  04/15/18 194 lb 3.2 oz (88.1 kg)  09/25/17 198 lb 6.4 oz (90 kg)  10/07/16 196 lb (88.9 kg)     GEN:  Well nourished, well developed in no acute distress HEENT: Seems to have nystagmus NECK: No JVD; No carotid bruits LYMPHATICS: No lymphadenopathy CARDIAC: RRR, no murmurs, rubs, gallops RESPIRATORY:  Clear to auscultation without rales, wheezing or rhonchi  ABDOMEN: Soft, non-tender, non-distended MUSCULOSKELETAL:  No edema; No deformity  SKIN: Warm and dry NEUROLOGIC:  Alert and oriented x 3, no tremor PSYCHIATRIC:  Normal affect   ASSESSMENT:    1. Bilateral carotid artery stenosis   2. Dizziness    PLAN:    In order of problems listed above:  Carotid artery disease - We will repeat a carotid Doppler in 1 year. -He does state that he is on a statin therapy [facility].  Continue. -I would also recommend low-dose aspirin 81 mg. -He does not require surgery based upon his carotid Dopplers.  Chronic dizziness -Echocardiogram reassuring.  ECG reassuring.  Does not sound electrical or cardiac hemodynamic. - Seems to be a long-standing issue for him brain MRI was unremarkable.  Might have some mild nystagmus noted.  He wonders if it is secondary to his prior sinus surgery.  I will refer him to Kerin Salen, MD with neurology, movement disorders.  Perhaps she will be able to shed some light on his situation.   Medication Adjustments/Labs and Tests Ordered: Current medicines are reviewed at length with the patient today.  Concerns regarding medicines are outlined above.  Orders Placed This Encounter  Procedures  . Ambulatory referral to Neurology  . EKG 12-Lead    Meds ordered this encounter  Medications  . aspirin EC 81 MG tablet    Sig: Take 1 tablet (81 mg total) by mouth daily.    Dispense:  90 tablet    Refill:  3    Patient Instructions  Medication Instructions:  The current medical regimen is effective;  continue present plan and medications.  Testing/Procedures: Your physician has requested that you have a carotid  duplex in 1 year. This test is an ultrasound of the carotid arteries in your neck. It looks at blood flow through these arteries that supply the brain with blood. Allow one hour for this exam. There are no restrictions or special instructions.  You have been referred to [facility] - Dr Lurena Joiner Tat - for evaluation of dizziness.  Follow-Up: Follow up with Dr Anne Fu as needed.  If you need a refill on your cardiac medications before your next appointment, please call your pharmacy.  Please call back with the name and dose of your cholesterol medication.    Signed, Donato Schultz, MD  04/15/2018 2:43 PM    [facility]

## 2018-04-20 ENCOUNTER — Encounter: Payer: Self-pay | Admitting: Neurology

## 2018-04-20 ENCOUNTER — Ambulatory Visit: Payer: Medicare HMO | Admitting: Neurology

## 2018-04-20 VITALS — BP 130/62 | HR 95 | Ht 68.0 in | Wt 195.0 lb

## 2018-04-20 DIAGNOSIS — R42 Dizziness and giddiness: Secondary | ICD-10-CM | POA: Diagnosis not present

## 2018-04-20 NOTE — Patient Instructions (Signed)
I would like to read the notes from your eye doctors.  If there is anything else, I will contact you.  Otherwise, I have no specific diagnosis.  Treatment is typically physical therapy/vestibular rehabilitation

## 2018-04-20 NOTE — Progress Notes (Signed)
NEUROLOGY FOLLOW UP OFFICE NOTE  Antonio Cisneros [id number]  HISTORY OF PRESENT ILLNESS: Antonio Cisneros is a 67 year old male with PTSD who follows up for dizziness.  He is accompanied by his wife who supplements history.  UPDATE: CTA of head performed on 11/13/17 was personally reviewed and revealed no vertebrobasilar insufficiency.  He has since seen cardiology.  Workup has been unremarkable.  He has reported some falls.  He states that he has seen at least 3 ophthalmologists.  He was given a prism.  HISTORY: Onset of his symptoms started 2 1/2 years ago.  It is unchanged.  There was no preceding event.  He did have a fall off of a ladder but did not hit his head.  He describes dizziness but it is difficult for him to elaborate.  It is not a spinning sensation and it is not a lightheadedness as if he thinks he will pass out.  When he sits still, he feels fine.  It is triggered when he stands up or looks up and down.  It may be brief or it can last all day.  He feels unsteady on his feet.  Although he stumbles he has not had any falls.  He denies nausea and vomiting.  He denies hearing loss but notes occasional ringing in the ears, which is not new.  Sometimes he notes mild vertical diplopia that disappears when closing either eye.  He denies unilateral numbness or weakness.  MRI of brain without contrast from 06/03/17 was personally reviewed and revealed no abnormality of the brain to explain symptoms, however it still demonstrated extensive acute on chronic sinus disease involving the ethmoid air cells, right frontal sinuses, and bilateral maxillary and sphenoid sinuses.  He states that his eyes have been evaluated.  He saw ophthalmology who prescribed him prisms.  PAST MEDICAL HISTORY: Past Medical History:  Diagnosis Date  . Depression   . PTSD (post-traumatic stress disorder)     MEDICATIONS: Current Outpatient Medications on File Prior to Visit  Medication Sig Dispense Refill  .  aspirin EC 81 MG tablet Take 1 tablet (81 mg total) by mouth daily. 90 tablet 3  . diclofenac (VOLTAREN) 75 MG EC tablet TAKE 1 TABLET BY MOUTH TWICE DAILY AS NEEDED FOR PAIN WITH FOOD OR MILK  5  . metoprolol succinate (TOPROL-XL) 50 MG 24 hr tablet Take 50 mg by mouth daily.      No current facility-administered medications on file prior to visit.     ALLERGIES: No Known Allergies  FAMILY HISTORY: Family History  Problem Relation Age of Onset  . Alcohol abuse Mother   . Dementia Father    SOCIAL HISTORY: Social History   Socioeconomic History  . Marital status: Married    Spouse name: Not on file  . Number of children: Not on file  . Years of education: Not on file  . Highest education level: Not on file  Occupational History  . Not on file  Social Needs  . Financial resource strain: Not on file  . Food insecurity:    Worry: Not on file    Inability: Not on file  . Transportation needs:    Medical: Not on file    Non-medical: Not on file  Tobacco Use  . Smoking status: Former Smoker    Packs/day: 1.50    Years: 25.00    Pack years: 37.50    Types: Cigarettes    Last attempt to quit: 1989  Years since quitting: 30.7  . Smokeless tobacco: Never Used  Substance and Sexual Activity  . Alcohol use: No  . Drug use: No  . Sexual activity: Not Currently  Lifestyle  . Physical activity:    Days per week: Not on file    Minutes per session: Not on file  . Stress: Not on file  Relationships  . Social connections:    Talks on phone: Not on file    Gets together: Not on file    Attends religious service: Not on file    Active member of club or organization: Not on file    Attends meetings of clubs or organizations: Not on file    Relationship status: Not on file  . Intimate partner violence:    Fear of current or ex partner: Not on file    Emotionally abused: Not on file    Physically abused: Not on file    Forced sexual activity: Not on file  Other Topics  Concern  . Not on file  Social History Narrative  . Not on file    REVIEW OF SYSTEMS: Constitutional: No fevers, chills, or sweats, no generalized fatigue, change in appetite Eyes: No visual changes, double vision, eye pain Ear, nose and throat: No hearing loss, ear pain, nasal congestion, sore throat Cardiovascular: No chest pain, palpitations Respiratory:  No shortness of breath at rest or with exertion, wheezes GastrointestinaI: No nausea, vomiting, diarrhea, abdominal pain, fecal incontinence Genitourinary:  No dysuria, urinary retention or frequency Musculoskeletal:  No neck pain, back pain Integumentary: No rash, pruritus, skin lesions Neurological: as above Psychiatric: No depression, insomnia, anxiety Endocrine: No palpitations, fatigue, diaphoresis, mood swings, change in appetite, change in weight, increased thirst Hematologic/Lymphatic:  No purpura, petechiae. Allergic/Immunologic: no itchy/runny eyes, nasal congestion, recent allergic reactions, rashes  PHYSICAL EXAM: Blood pressure 130/62, pulse 95, height 5\' 8"  (1.727 m), weight 195 lb (88.5 kg), SpO2 97 %. General: No acute distress.  Patient appears well-groomed.   Head:  Normocephalic/atraumatic Eyes:  Fundi examined but not visualized Neck: supple, no paraspinal tenderness, full range of motion Heart:  Regular rate and rhythm Lungs:  Clear to auscultation bilaterally Back: No paraspinal tenderness Neurological Exam: alert and oriented to person, place, and time. Attention span and concentration intact, recent and remote memory intact, fund of knowledge intact.  Speech fluent and not dysarthric, language intact.  Saccadic eye movements left and right on tracking.  Otherwise, CN II-XII intact. Bulk and tone normal, muscle strength 5/5 throughout.  Sensation to light touch, temperature and vibration intact.  Deep tendon reflexes 2+ throughout, toes downgoing.  Finger to nose and heel to shin testing intact.  Gait normal,  Romberg negative.  IMPRESSION: Chronic dizziness.  Unclear etiology.  He does not have vertebrobasilar insufficiency.  Symptoms not consistent with migraine.  He endorses diplopia.  No clear ophthalmoplegia noted on exam or MRI finding to explain this.  I want to review notes from his ophthalmologist.  After review, I will contact him if I feel any further neurologic workup or follow up is warranted.  Otherwise, I recommend continuing exercises that he learned from vestibular rehab.  25 minutes spent face to face with patient, over 50% spent discussing management.  Shon Millet, DO  CC:  Marin Comment, FNP  Donato Schultz, MD

## 2018-04-23 ENCOUNTER — Ambulatory Visit
Admission: RE | Admit: 2018-04-23 | Discharge: 2018-04-23 | Disposition: A | Discharge status: Home / Self Care | Payer: Medicare HMO | Source: Ambulatory Visit | Attending: Rehabilitation | Admitting: Rehabilitation

## 2018-04-23 DIAGNOSIS — M542 Cervicalgia: Secondary | ICD-10-CM | POA: Diagnosis not present

## 2018-05-04 DIAGNOSIS — M5481 Occipital neuralgia: Secondary | ICD-10-CM | POA: Diagnosis not present

## 2018-05-04 DIAGNOSIS — M47892 Other spondylosis, cervical region: Secondary | ICD-10-CM | POA: Diagnosis not present

## 2018-05-04 DIAGNOSIS — M542 Cervicalgia: Secondary | ICD-10-CM | POA: Diagnosis not present

## 2018-05-11 IMAGING — CT CT ANGIO HEAD
2 of 5 series · 5 of 30 positions shown · IV contrast (APPLIED)
Comparison: CT head 10/10/2017

CLINICAL DATA: Disequilibrium.  Unsteady gait

Creatinine was obtained on site at [HOSPITAL] at [HOSPITAL].
Results: Creatinine 1.3 mg/dL.
EXAM:
CT ANGIOGRAPHY HEAD
TECHNIQUE: Multidetector CT imaging of the head was performed using the
standard protocol during bolus administration of intravenous
contrast. Multiplanar CT image reconstructions and MIPs were
obtained to evaluate the vascular anatomy.
CONTRAST:  75mL VEL4T3-01K IOPAMIDOL (VEL4T3-01K) INJECTION 76%

[Series 2: head w/(date) · axial · 0.41mm/px · z∈[+18,+74]mm · 2 of 33 slices shown]
[im 11/33  brain]
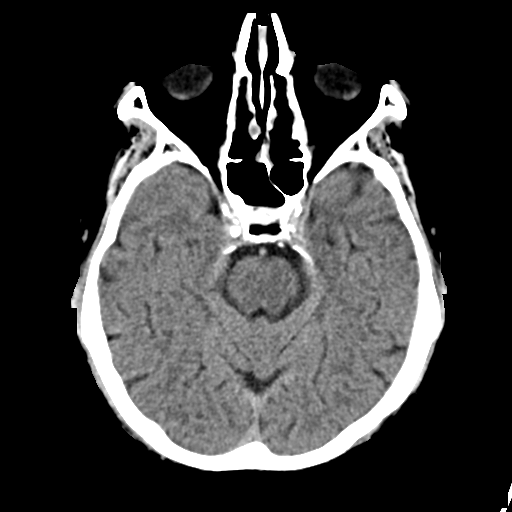
[im 22/33  brain]
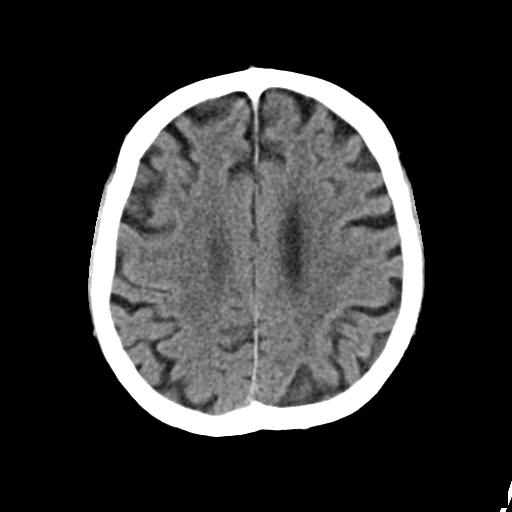

[Series 10: head angio · axial · 0.41mm/px · z∈[+8,+86]mm · 3 of 54 slices shown]
[im 14/54  brain]
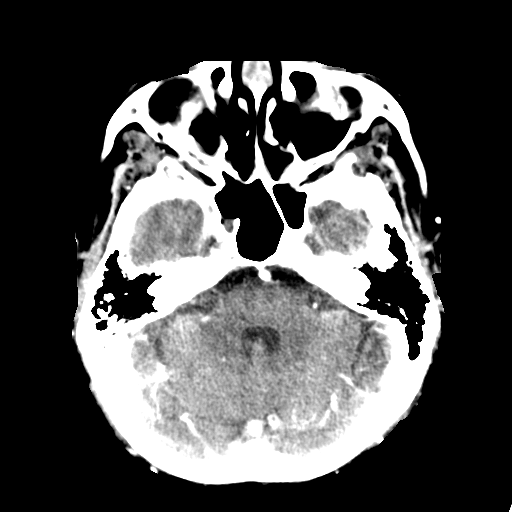
[im 27/54  bone]
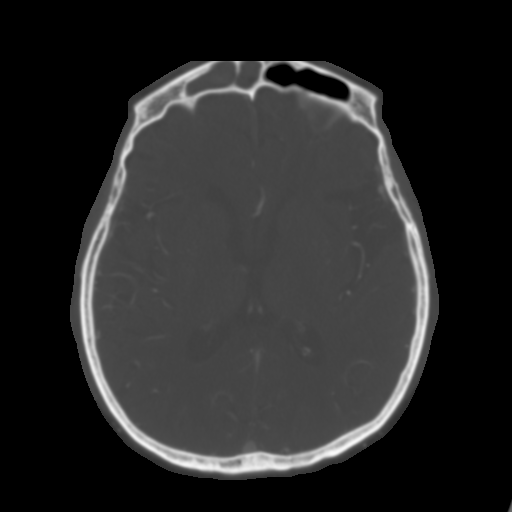
[im 40/54  brain]
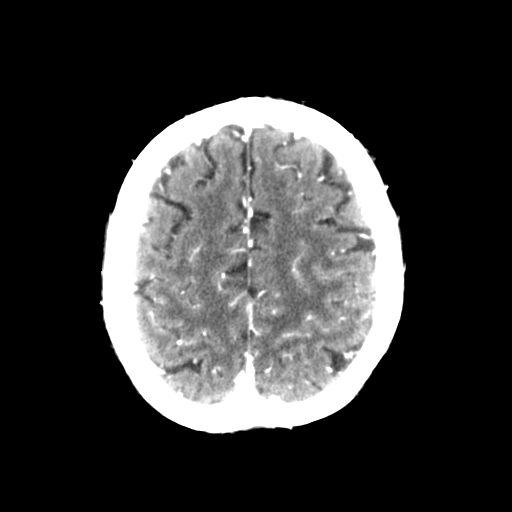

[5 of 30 positions shown; findings below may reference images not displayed]

FINDINGS: CT HEAD

Brain: Moderate atrophy. Negative for hydrocephalus. Mild chronic
microvascular ischemic change in the white matter. No acute infarct,
hemorrhage, or mass.

Vascular: Negative for hyperdense vessel

Skull: Negative

Sinuses: Mucosal edema paranasal sinuses. Bilateral ethmoidectomy
and medial antrostomy. Mucosal edema right sphenoid sinus. Mastoid
sinus clear bilaterally.

Orbits: Negative

CTA HEAD

Anterior circulation: Atherosclerotic calcification in the cavernous
carotid bilaterally without significant stenosis. Negative for
cavernous carotid aneurysm. Anterior and middle cerebral arteries
patent bilaterally without stenosis or occlusion

Posterior circulation: Both vertebral arteries contribute to the
basilar without stenosis. Left vertebral dominant. Basilar widely
patent. PICA, AICA, superior cerebellar, and posterior cerebral
arteries patent bilaterally without stenosis.

Venous sinuses: Normal enhancement

Anatomic variants: None

Delayed phase: Normal enhancement postcontrast administration.
IMPRESSION: Atrophy and chronic microvascular ischemia. No acute intracranial
abnormality

Mucosal edema throughout the paranasal sinuses with prior sinus
surgery

Atherosclerotic calcification in the cavernous carotid bilaterally
without significant stenosis. No significant intracranial stenosis.
Negative for cerebral aneurysm or vascular malformation.

## 2018-05-13 DIAGNOSIS — M542 Cervicalgia: Secondary | ICD-10-CM | POA: Diagnosis not present

## 2018-05-21 DIAGNOSIS — H5203 Hypermetropia, bilateral: Secondary | ICD-10-CM | POA: Diagnosis not present

## 2018-05-21 DIAGNOSIS — H524 Presbyopia: Secondary | ICD-10-CM | POA: Diagnosis not present

## 2018-05-21 DIAGNOSIS — H52223 Regular astigmatism, bilateral: Secondary | ICD-10-CM | POA: Diagnosis not present

## 2018-05-27 DIAGNOSIS — M542 Cervicalgia: Secondary | ICD-10-CM | POA: Diagnosis not present

## 2018-06-09 DIAGNOSIS — M542 Cervicalgia: Secondary | ICD-10-CM | POA: Diagnosis not present

## 2018-06-14 DIAGNOSIS — I6529 Occlusion and stenosis of unspecified carotid artery: Secondary | ICD-10-CM | POA: Diagnosis not present

## 2018-06-14 DIAGNOSIS — E119 Type 2 diabetes mellitus without complications: Secondary | ICD-10-CM | POA: Diagnosis not present

## 2018-06-14 DIAGNOSIS — Z139 Encounter for screening, unspecified: Secondary | ICD-10-CM | POA: Diagnosis not present

## 2018-06-14 DIAGNOSIS — R42 Dizziness and giddiness: Secondary | ICD-10-CM | POA: Diagnosis not present

## 2018-06-14 DIAGNOSIS — R05 Cough: Secondary | ICD-10-CM | POA: Diagnosis not present

## 2018-06-14 DIAGNOSIS — E039 Hypothyroidism, unspecified: Secondary | ICD-10-CM | POA: Diagnosis not present

## 2018-06-14 DIAGNOSIS — I6501 Occlusion and stenosis of right vertebral artery: Secondary | ICD-10-CM | POA: Diagnosis not present

## 2018-06-14 DIAGNOSIS — E7800 Pure hypercholesterolemia, unspecified: Secondary | ICD-10-CM | POA: Diagnosis not present

## 2018-06-14 DIAGNOSIS — N419 Inflammatory disease of prostate, unspecified: Secondary | ICD-10-CM | POA: Diagnosis not present

## 2018-06-16 DIAGNOSIS — N529 Male erectile dysfunction, unspecified: Secondary | ICD-10-CM | POA: Diagnosis not present

## 2018-06-16 DIAGNOSIS — M545 Low back pain: Secondary | ICD-10-CM | POA: Diagnosis not present

## 2018-06-16 DIAGNOSIS — M8448XA Pathological fracture, other site, initial encounter for fracture: Secondary | ICD-10-CM | POA: Diagnosis not present

## 2018-06-16 DIAGNOSIS — M4802 Spinal stenosis, cervical region: Secondary | ICD-10-CM | POA: Diagnosis not present

## 2018-06-16 DIAGNOSIS — M542 Cervicalgia: Secondary | ICD-10-CM | POA: Diagnosis not present

## 2018-06-16 DIAGNOSIS — M2578 Osteophyte, vertebrae: Secondary | ICD-10-CM | POA: Diagnosis not present

## 2018-06-16 DIAGNOSIS — M47816 Spondylosis without myelopathy or radiculopathy, lumbar region: Secondary | ICD-10-CM | POA: Diagnosis not present

## 2018-06-16 DIAGNOSIS — R52 Pain, unspecified: Secondary | ICD-10-CM | POA: Diagnosis not present

## 2018-06-16 DIAGNOSIS — M546 Pain in thoracic spine: Secondary | ICD-10-CM | POA: Diagnosis not present

## 2018-06-16 DIAGNOSIS — S32010A Wedge compression fracture of first lumbar vertebra, initial encounter for closed fracture: Secondary | ICD-10-CM | POA: Diagnosis not present

## 2018-06-16 DIAGNOSIS — F4321 Adjustment disorder with depressed mood: Secondary | ICD-10-CM | POA: Diagnosis not present

## 2018-06-16 DIAGNOSIS — M47818 Spondylosis without myelopathy or radiculopathy, sacral and sacrococcygeal region: Secondary | ICD-10-CM | POA: Diagnosis not present

## 2018-06-20 DIAGNOSIS — J986 Disorders of diaphragm: Secondary | ICD-10-CM | POA: Diagnosis not present

## 2018-06-20 DIAGNOSIS — I10 Essential (primary) hypertension: Secondary | ICD-10-CM | POA: Diagnosis not present

## 2018-06-20 DIAGNOSIS — E871 Hypo-osmolality and hyponatremia: Secondary | ICD-10-CM | POA: Diagnosis not present

## 2018-06-20 DIAGNOSIS — E876 Hypokalemia: Secondary | ICD-10-CM | POA: Diagnosis not present

## 2018-06-20 DIAGNOSIS — A419 Sepsis, unspecified organism: Secondary | ICD-10-CM | POA: Diagnosis not present

## 2018-06-20 DIAGNOSIS — E860 Dehydration: Secondary | ICD-10-CM | POA: Diagnosis not present

## 2018-06-20 DIAGNOSIS — N179 Acute kidney failure, unspecified: Secondary | ICD-10-CM | POA: Diagnosis not present

## 2018-06-20 DIAGNOSIS — R7989 Other specified abnormal findings of blood chemistry: Secondary | ICD-10-CM | POA: Diagnosis not present

## 2018-06-20 DIAGNOSIS — R4789 Other speech disturbances: Secondary | ICD-10-CM | POA: Diagnosis not present

## 2018-06-20 DIAGNOSIS — R944 Abnormal results of kidney function studies: Secondary | ICD-10-CM | POA: Diagnosis not present

## 2018-06-20 DIAGNOSIS — R339 Retention of urine, unspecified: Secondary | ICD-10-CM | POA: Diagnosis not present

## 2018-06-20 DIAGNOSIS — R4781 Slurred speech: Secondary | ICD-10-CM | POA: Diagnosis not present

## 2018-06-21 DIAGNOSIS — E871 Hypo-osmolality and hyponatremia: Secondary | ICD-10-CM | POA: Diagnosis not present

## 2018-06-21 DIAGNOSIS — R5381 Other malaise: Secondary | ICD-10-CM | POA: Diagnosis not present

## 2018-06-21 DIAGNOSIS — G231 Progressive supranuclear ophthalmoplegia [Steele-Richardson-Olszewski]: Secondary | ICD-10-CM | POA: Diagnosis not present

## 2018-06-21 DIAGNOSIS — F4312 Post-traumatic stress disorder, chronic: Secondary | ICD-10-CM | POA: Diagnosis not present

## 2018-06-21 DIAGNOSIS — F329 Major depressive disorder, single episode, unspecified: Secondary | ICD-10-CM | POA: Diagnosis not present

## 2018-06-21 DIAGNOSIS — R531 Weakness: Secondary | ICD-10-CM | POA: Diagnosis not present

## 2018-06-21 DIAGNOSIS — R509 Fever, unspecified: Secondary | ICD-10-CM | POA: Diagnosis not present

## 2018-06-21 DIAGNOSIS — H532 Diplopia: Secondary | ICD-10-CM | POA: Diagnosis not present

## 2018-06-21 DIAGNOSIS — Z87891 Personal history of nicotine dependence: Secondary | ICD-10-CM | POA: Diagnosis not present

## 2018-06-21 DIAGNOSIS — J309 Allergic rhinitis, unspecified: Secondary | ICD-10-CM | POA: Diagnosis not present

## 2018-06-21 DIAGNOSIS — Z792 Long term (current) use of antibiotics: Secondary | ICD-10-CM | POA: Diagnosis not present

## 2018-06-21 DIAGNOSIS — A770 Spotted fever due to Rickettsia rickettsii: Secondary | ICD-10-CM | POA: Diagnosis not present

## 2018-06-21 DIAGNOSIS — E860 Dehydration: Secondary | ICD-10-CM | POA: Diagnosis not present

## 2018-06-21 DIAGNOSIS — R633 Feeding difficulties: Secondary | ICD-10-CM | POA: Diagnosis not present

## 2018-06-21 DIAGNOSIS — R7989 Other specified abnormal findings of blood chemistry: Secondary | ICD-10-CM | POA: Diagnosis not present

## 2018-06-21 DIAGNOSIS — R1310 Dysphagia, unspecified: Secondary | ICD-10-CM | POA: Insufficient documentation

## 2018-06-21 DIAGNOSIS — F4310 Post-traumatic stress disorder, unspecified: Secondary | ICD-10-CM | POA: Diagnosis not present

## 2018-06-21 DIAGNOSIS — G9341 Metabolic encephalopathy: Secondary | ICD-10-CM | POA: Diagnosis not present

## 2018-06-21 DIAGNOSIS — R339 Retention of urine, unspecified: Secondary | ICD-10-CM | POA: Diagnosis not present

## 2018-06-21 DIAGNOSIS — R768 Other specified abnormal immunological findings in serum: Secondary | ICD-10-CM | POA: Diagnosis not present

## 2018-06-21 DIAGNOSIS — I10 Essential (primary) hypertension: Secondary | ICD-10-CM | POA: Diagnosis not present

## 2018-06-21 DIAGNOSIS — R296 Repeated falls: Secondary | ICD-10-CM | POA: Diagnosis not present

## 2018-06-21 DIAGNOSIS — R42 Dizziness and giddiness: Secondary | ICD-10-CM | POA: Diagnosis not present

## 2018-06-21 DIAGNOSIS — N200 Calculus of kidney: Secondary | ICD-10-CM | POA: Diagnosis not present

## 2018-06-21 DIAGNOSIS — G20 Parkinson's disease: Secondary | ICD-10-CM | POA: Diagnosis not present

## 2018-06-21 DIAGNOSIS — R4781 Slurred speech: Secondary | ICD-10-CM | POA: Diagnosis not present

## 2018-06-21 DIAGNOSIS — R944 Abnormal results of kidney function studies: Secondary | ICD-10-CM | POA: Diagnosis not present

## 2018-06-21 DIAGNOSIS — I2510 Atherosclerotic heart disease of native coronary artery without angina pectoris: Secondary | ICD-10-CM | POA: Diagnosis not present

## 2018-06-21 DIAGNOSIS — R471 Dysarthria and anarthria: Secondary | ICD-10-CM | POA: Diagnosis not present

## 2018-06-21 DIAGNOSIS — F05 Delirium due to known physiological condition: Secondary | ICD-10-CM | POA: Diagnosis not present

## 2018-06-21 DIAGNOSIS — E876 Hypokalemia: Secondary | ICD-10-CM | POA: Diagnosis not present

## 2018-06-21 DIAGNOSIS — R2681 Unsteadiness on feet: Secondary | ICD-10-CM | POA: Diagnosis not present

## 2018-06-21 DIAGNOSIS — N179 Acute kidney failure, unspecified: Secondary | ICD-10-CM | POA: Diagnosis not present

## 2018-06-21 DIAGNOSIS — Z79899 Other long term (current) drug therapy: Secondary | ICD-10-CM | POA: Diagnosis not present

## 2018-06-21 DIAGNOSIS — R410 Disorientation, unspecified: Secondary | ICD-10-CM | POA: Diagnosis not present

## 2018-06-21 DIAGNOSIS — I248 Other forms of acute ischemic heart disease: Secondary | ICD-10-CM | POA: Diagnosis not present

## 2018-06-21 DIAGNOSIS — E440 Moderate protein-calorie malnutrition: Secondary | ICD-10-CM | POA: Diagnosis not present

## 2018-06-22 DIAGNOSIS — Z008 Encounter for other general examination: Secondary | ICD-10-CM | POA: Insufficient documentation

## 2018-06-22 DIAGNOSIS — Z8659 Personal history of other mental and behavioral disorders: Secondary | ICD-10-CM | POA: Insufficient documentation

## 2018-06-27 IMAGING — DX DG RIBS W/ CHEST 3+V*L*
3 series · 3 of 3 positions shown · non-contrast
Comparison: CT 12/08/2014

CLINICAL DATA: Recent falls. Left anterior rib pain. Chronic cough

EXAM:
LEFT RIBS AND CHEST - 3+ VIEW

[dg ribs unilateral w/chest left (1 of 3)]
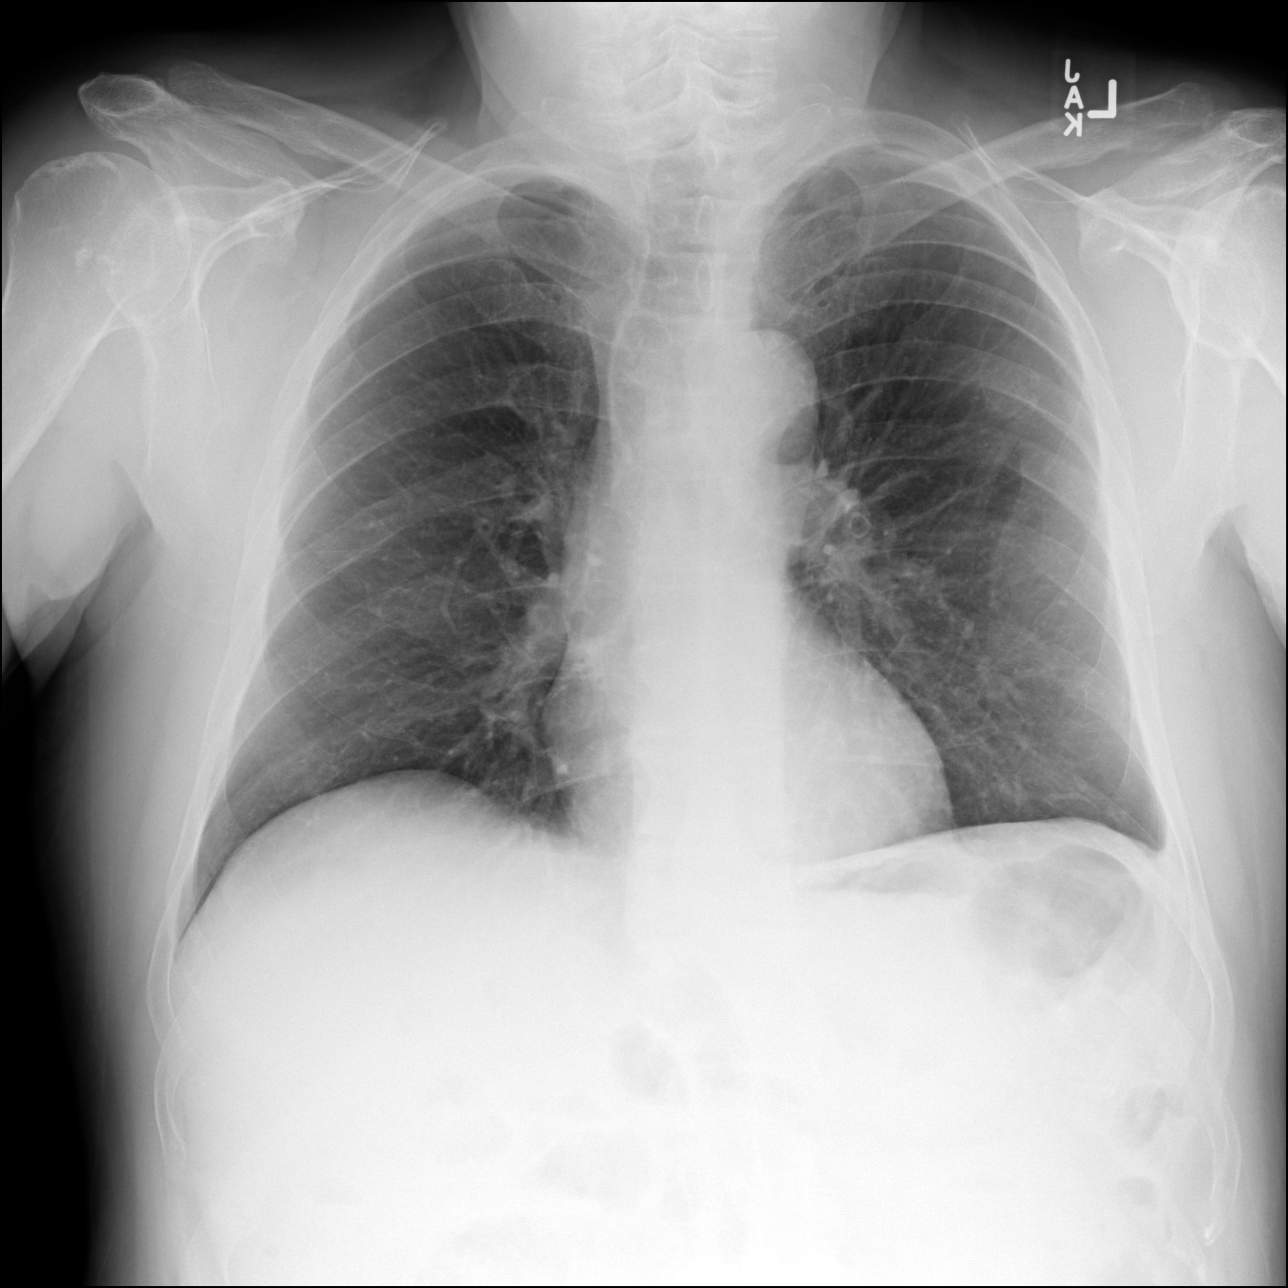

[dg ribs unilateral w/chest left (2 of 3)]
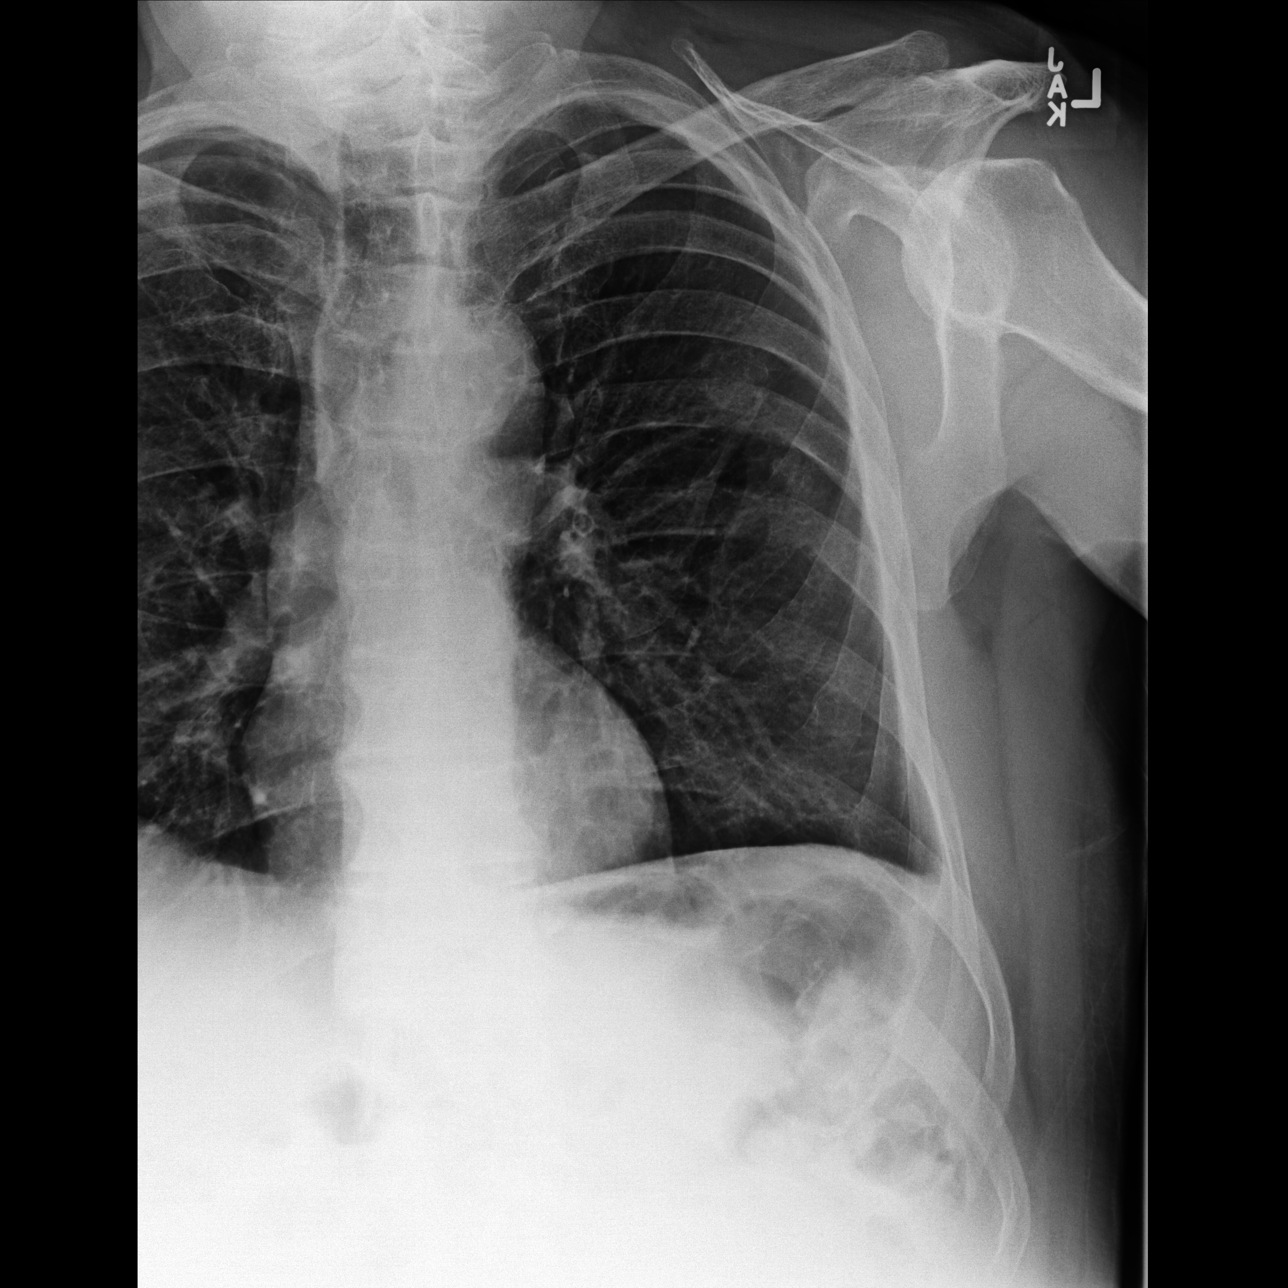

[dg ribs unilateral w/chest left (3 of 3)]
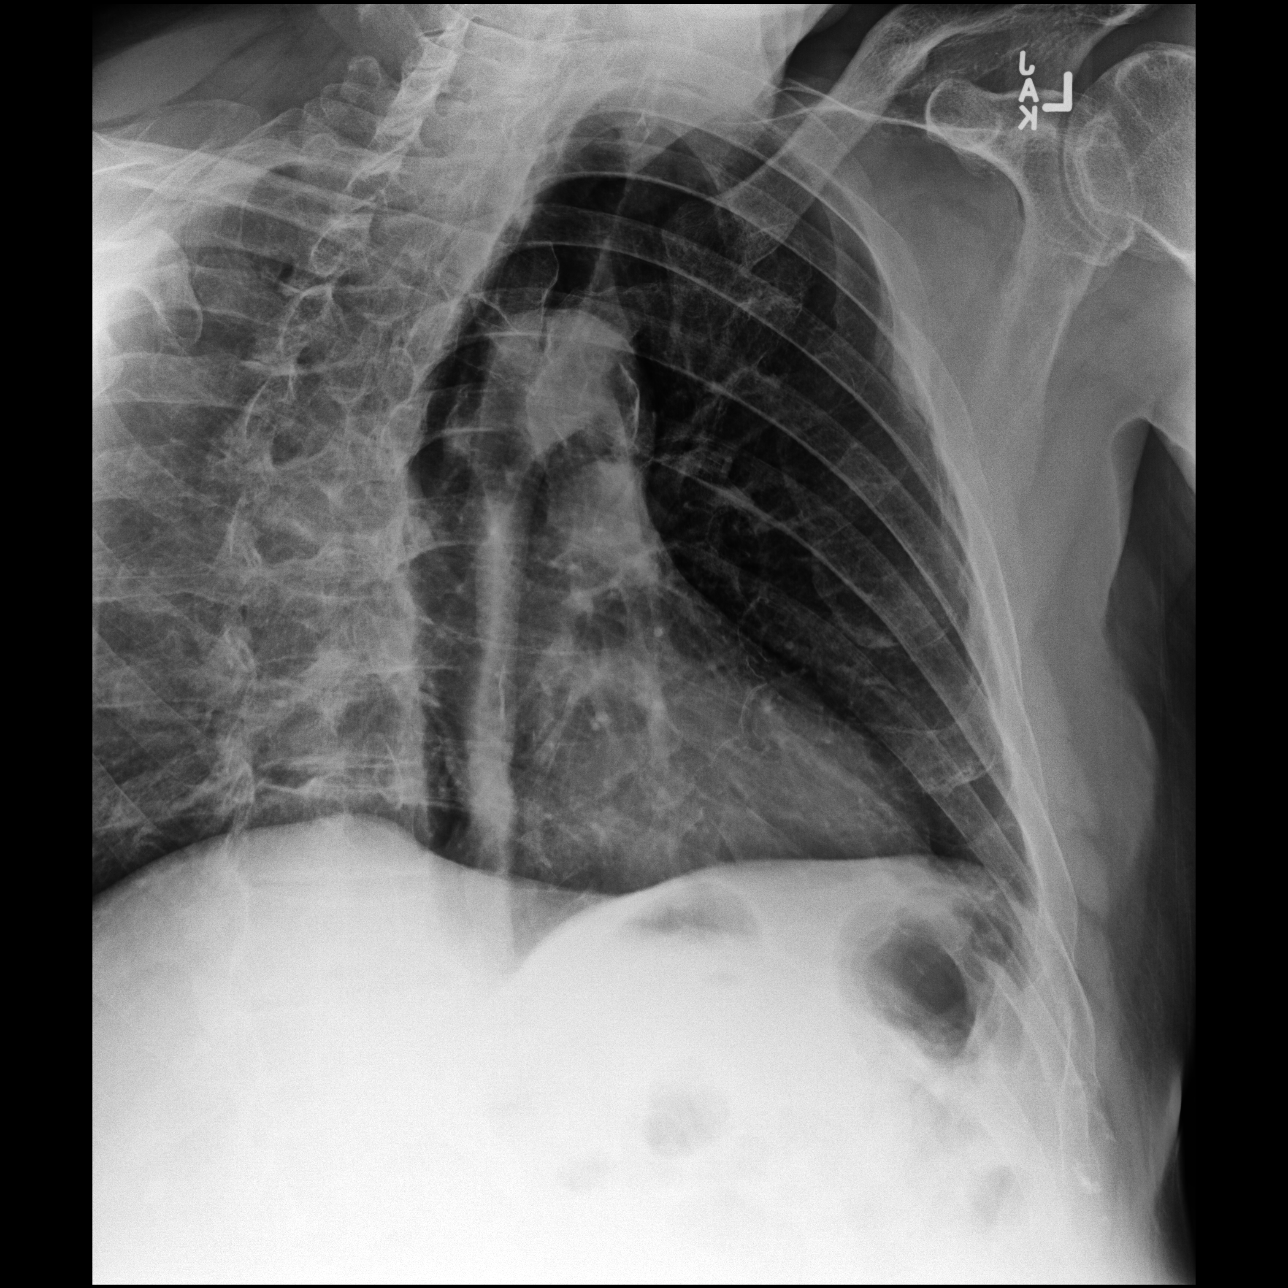

[3 of 3 positions shown; findings below may reference images not displayed]

FINDINGS: No visible acute left rib fracture. Deformity of the anterior left
4th rib is stable since prior study compatible with old rib
fracture. Heart and mediastinal contours are within normal limits.
No focal opacities or effusions. No acute bony abnormality.
IMPRESSION: No active cardiopulmonary disease.

## 2018-07-02 DIAGNOSIS — F05 Delirium due to known physiological condition: Secondary | ICD-10-CM | POA: Diagnosis not present

## 2018-07-02 DIAGNOSIS — N179 Acute kidney failure, unspecified: Secondary | ICD-10-CM | POA: Diagnosis not present

## 2018-07-02 DIAGNOSIS — F4310 Post-traumatic stress disorder, unspecified: Secondary | ICD-10-CM | POA: Diagnosis not present

## 2018-07-02 DIAGNOSIS — R5381 Other malaise: Secondary | ICD-10-CM | POA: Diagnosis not present

## 2018-07-02 DIAGNOSIS — R531 Weakness: Secondary | ICD-10-CM | POA: Diagnosis not present

## 2018-07-02 DIAGNOSIS — J309 Allergic rhinitis, unspecified: Secondary | ICD-10-CM | POA: Diagnosis not present

## 2018-07-02 DIAGNOSIS — R05 Cough: Secondary | ICD-10-CM | POA: Diagnosis not present

## 2018-07-02 DIAGNOSIS — R410 Disorientation, unspecified: Secondary | ICD-10-CM | POA: Diagnosis not present

## 2018-07-02 DIAGNOSIS — G20 Parkinson's disease: Secondary | ICD-10-CM | POA: Diagnosis not present

## 2018-07-02 DIAGNOSIS — R509 Fever, unspecified: Secondary | ICD-10-CM | POA: Diagnosis not present

## 2018-07-02 DIAGNOSIS — F329 Major depressive disorder, single episode, unspecified: Secondary | ICD-10-CM | POA: Diagnosis not present

## 2018-07-02 DIAGNOSIS — Z87891 Personal history of nicotine dependence: Secondary | ICD-10-CM | POA: Diagnosis not present

## 2018-07-02 DIAGNOSIS — R7989 Other specified abnormal findings of blood chemistry: Secondary | ICD-10-CM | POA: Diagnosis not present

## 2018-07-02 DIAGNOSIS — G231 Progressive supranuclear ophthalmoplegia [Steele-Richardson-Olszewski]: Secondary | ICD-10-CM | POA: Diagnosis not present

## 2018-07-02 DIAGNOSIS — I10 Essential (primary) hypertension: Secondary | ICD-10-CM | POA: Diagnosis not present

## 2018-07-02 DIAGNOSIS — R1310 Dysphagia, unspecified: Secondary | ICD-10-CM | POA: Diagnosis not present

## 2018-07-05 DIAGNOSIS — R5381 Other malaise: Secondary | ICD-10-CM | POA: Diagnosis not present

## 2018-07-05 DIAGNOSIS — I10 Essential (primary) hypertension: Secondary | ICD-10-CM | POA: Diagnosis not present

## 2018-07-05 DIAGNOSIS — G20 Parkinson's disease: Secondary | ICD-10-CM | POA: Diagnosis not present

## 2018-07-05 DIAGNOSIS — G231 Progressive supranuclear ophthalmoplegia [Steele-Richardson-Olszewski]: Secondary | ICD-10-CM | POA: Diagnosis not present

## 2018-07-12 ENCOUNTER — Other Ambulatory Visit: Payer: Self-pay | Admitting: *Deleted

## 2018-07-12 DIAGNOSIS — E291 Testicular hypofunction: Secondary | ICD-10-CM | POA: Diagnosis not present

## 2018-07-12 DIAGNOSIS — Z139 Encounter for screening, unspecified: Secondary | ICD-10-CM | POA: Diagnosis not present

## 2018-07-12 DIAGNOSIS — N529 Male erectile dysfunction, unspecified: Secondary | ICD-10-CM | POA: Diagnosis not present

## 2018-07-12 DIAGNOSIS — A6920 Lyme disease, unspecified: Secondary | ICD-10-CM | POA: Diagnosis not present

## 2018-07-12 DIAGNOSIS — J019 Acute sinusitis, unspecified: Secondary | ICD-10-CM | POA: Diagnosis not present

## 2018-07-12 NOTE — Patient Outreach (Signed)
[facility]) Care Management  07/12/2018  Joelene MillinMichael F Acres March 15, 1951 [id number]   Transition of Care Referral   Referral Date: 07/12/18 Referral Source: [employer] daily census high priority  Date of Admission: 07/02/18 Diagnosis: physical deconditioning, HTN, allergies, dysphagia, weakness ,dizziness, hx of depression , hx PTSD,  Date of Discharge:On 07/08/18.  [facility]  Insurance: [facility]  Hospital admission x 2 in last 6 months    Outreach attempt #1 successful contact to the home, His wife reports he is sleeping  Patient's wife is able to verify HIPAA Reviewed and addressed Transitional of care referral with patient's wife   Mrs Elsie LincolnRouth states Mr Elsie LincolnRouth is "doing fine" She denies need for assist from [facility], RN CM or pharmacy  She denies further dizziness, weakness, issues with BP or  depression  Mrs Elsie LincolnRouth states [facility]   Mr Ane PaymentRouth thanked [facility] RN CM for following up with Mr Elsie LincolnRouth   Social: Mr Elsie LincolnRouth lives with his wife, primary caregiver who assists with transportation to medical appointments. She reports Mr Elsie LincolnRouth is now walking without assistive devices and doing ADLs but needs assists with only some iADLs.   Conditions: physical deconditioning due to parkinson, Progressive Supranuclear Palsy, HTN, allergies, dysphagia, weakness ,dizziness, hx of depression , hx PTSD,  former smoker   Medications:  Appointments: primary Marin Commentcheryl Wells, FNP went today 07/12/18  Mr Elsie LincolnRouth [facility] and speech therapy on 07/21/18  To see neurologist Hettie HolsteinMatthew Burton on 07/23/18    Advance directives:  Consent: [facility] RN CM reviewed [facility] with patient. Patient gave verbal consent for services. Advised patient that other post discharge calls may occur to assess how the patient is doing following the recent hospitalization. Patient voiced understanding and was appreciative of f/u call.  [facility]  RN CM will close case at this time as patient has been assessed and no needs identified.   [facility] RN CM sent a successful outreach letter as discussed [facility] broch[facility] enclosed for review  Pt encouraged to return a call [facility] RN CM prn     Aurelius Gildersleeve L. Noelle PennerGibbs, RN, BSN, CCM [facility] Telephonic Care Management Care Coordinator Direct Number [phone] Mobile number [phone]  Main [facility] number [phone] Fax number [phone]

## 2018-07-21 DIAGNOSIS — G231 Progressive supranuclear ophthalmoplegia [Steele-Richardson-Olszewski]: Secondary | ICD-10-CM | POA: Diagnosis not present

## 2018-07-21 DIAGNOSIS — R471 Dysarthria and anarthria: Secondary | ICD-10-CM | POA: Diagnosis not present

## 2018-07-21 DIAGNOSIS — R531 Weakness: Secondary | ICD-10-CM | POA: Diagnosis not present

## 2018-07-21 DIAGNOSIS — R413 Other amnesia: Secondary | ICD-10-CM | POA: Diagnosis not present

## 2018-07-21 DIAGNOSIS — Z9181 History of falling: Secondary | ICD-10-CM | POA: Diagnosis not present

## 2018-07-23 DIAGNOSIS — G231 Progressive supranuclear ophthalmoplegia [Steele-Richardson-Olszewski]: Secondary | ICD-10-CM | POA: Diagnosis not present

## 2018-07-26 DIAGNOSIS — R471 Dysarthria and anarthria: Secondary | ICD-10-CM | POA: Diagnosis not present

## 2018-07-26 DIAGNOSIS — R413 Other amnesia: Secondary | ICD-10-CM | POA: Diagnosis not present

## 2018-07-26 DIAGNOSIS — G231 Progressive supranuclear ophthalmoplegia [Steele-Richardson-Olszewski]: Secondary | ICD-10-CM | POA: Diagnosis not present

## 2018-07-26 DIAGNOSIS — R531 Weakness: Secondary | ICD-10-CM | POA: Diagnosis not present

## 2018-07-26 DIAGNOSIS — Z9181 History of falling: Secondary | ICD-10-CM | POA: Diagnosis not present

## 2018-07-28 DIAGNOSIS — E291 Testicular hypofunction: Secondary | ICD-10-CM | POA: Diagnosis not present

## 2018-07-28 DIAGNOSIS — R471 Dysarthria and anarthria: Secondary | ICD-10-CM | POA: Diagnosis not present

## 2018-07-28 DIAGNOSIS — A6920 Lyme disease, unspecified: Secondary | ICD-10-CM | POA: Diagnosis not present

## 2018-07-28 DIAGNOSIS — N529 Male erectile dysfunction, unspecified: Secondary | ICD-10-CM | POA: Diagnosis not present

## 2018-07-28 DIAGNOSIS — G231 Progressive supranuclear ophthalmoplegia [Steele-Richardson-Olszewski]: Secondary | ICD-10-CM | POA: Diagnosis not present

## 2018-07-28 DIAGNOSIS — J019 Acute sinusitis, unspecified: Secondary | ICD-10-CM | POA: Diagnosis not present

## 2018-07-28 DIAGNOSIS — R531 Weakness: Secondary | ICD-10-CM | POA: Diagnosis not present

## 2018-07-28 DIAGNOSIS — R413 Other amnesia: Secondary | ICD-10-CM | POA: Diagnosis not present

## 2018-07-29 DIAGNOSIS — R531 Weakness: Secondary | ICD-10-CM | POA: Diagnosis not present

## 2018-07-29 DIAGNOSIS — Z9181 History of falling: Secondary | ICD-10-CM | POA: Diagnosis not present

## 2018-08-04 DIAGNOSIS — Z9181 History of falling: Secondary | ICD-10-CM | POA: Diagnosis not present

## 2018-08-04 DIAGNOSIS — R471 Dysarthria and anarthria: Secondary | ICD-10-CM | POA: Diagnosis not present

## 2018-08-04 DIAGNOSIS — R413 Other amnesia: Secondary | ICD-10-CM | POA: Diagnosis not present

## 2018-08-04 DIAGNOSIS — G231 Progressive supranuclear ophthalmoplegia [Steele-Richardson-Olszewski]: Secondary | ICD-10-CM | POA: Diagnosis not present

## 2018-08-04 DIAGNOSIS — R531 Weakness: Secondary | ICD-10-CM | POA: Diagnosis not present

## 2018-08-11 DIAGNOSIS — R531 Weakness: Secondary | ICD-10-CM | POA: Diagnosis not present

## 2018-08-11 DIAGNOSIS — G231 Progressive supranuclear ophthalmoplegia [Steele-Richardson-Olszewski]: Secondary | ICD-10-CM | POA: Diagnosis not present

## 2018-08-11 DIAGNOSIS — R413 Other amnesia: Secondary | ICD-10-CM | POA: Diagnosis not present

## 2018-08-11 DIAGNOSIS — Z9181 History of falling: Secondary | ICD-10-CM | POA: Diagnosis not present

## 2018-08-11 DIAGNOSIS — R471 Dysarthria and anarthria: Secondary | ICD-10-CM | POA: Diagnosis not present

## 2018-08-18 DIAGNOSIS — R531 Weakness: Secondary | ICD-10-CM | POA: Diagnosis not present

## 2018-08-18 DIAGNOSIS — R413 Other amnesia: Secondary | ICD-10-CM | POA: Diagnosis not present

## 2018-08-18 DIAGNOSIS — R471 Dysarthria and anarthria: Secondary | ICD-10-CM | POA: Diagnosis not present

## 2018-08-18 DIAGNOSIS — G231 Progressive supranuclear ophthalmoplegia [Steele-Richardson-Olszewski]: Secondary | ICD-10-CM | POA: Diagnosis not present

## 2018-08-18 DIAGNOSIS — Z9181 History of falling: Secondary | ICD-10-CM | POA: Diagnosis not present

## 2018-08-20 DIAGNOSIS — R413 Other amnesia: Secondary | ICD-10-CM | POA: Diagnosis not present

## 2018-08-20 DIAGNOSIS — Z9181 History of falling: Secondary | ICD-10-CM | POA: Diagnosis not present

## 2018-08-20 DIAGNOSIS — R531 Weakness: Secondary | ICD-10-CM | POA: Diagnosis not present

## 2018-08-20 DIAGNOSIS — G231 Progressive supranuclear ophthalmoplegia [Steele-Richardson-Olszewski]: Secondary | ICD-10-CM | POA: Diagnosis not present

## 2018-08-20 DIAGNOSIS — R471 Dysarthria and anarthria: Secondary | ICD-10-CM | POA: Diagnosis not present

## 2018-08-23 DIAGNOSIS — R471 Dysarthria and anarthria: Secondary | ICD-10-CM | POA: Diagnosis not present

## 2018-08-23 DIAGNOSIS — R413 Other amnesia: Secondary | ICD-10-CM | POA: Diagnosis not present

## 2018-08-23 DIAGNOSIS — G231 Progressive supranuclear ophthalmoplegia [Steele-Richardson-Olszewski]: Secondary | ICD-10-CM | POA: Diagnosis not present

## 2018-08-23 DIAGNOSIS — Z9181 History of falling: Secondary | ICD-10-CM | POA: Diagnosis not present

## 2018-08-23 DIAGNOSIS — R531 Weakness: Secondary | ICD-10-CM | POA: Diagnosis not present

## 2018-08-30 DIAGNOSIS — R471 Dysarthria and anarthria: Secondary | ICD-10-CM | POA: Diagnosis not present

## 2018-08-30 DIAGNOSIS — R413 Other amnesia: Secondary | ICD-10-CM | POA: Diagnosis not present

## 2018-08-30 DIAGNOSIS — G231 Progressive supranuclear ophthalmoplegia [Steele-Richardson-Olszewski]: Secondary | ICD-10-CM | POA: Diagnosis not present

## 2018-08-30 DIAGNOSIS — R531 Weakness: Secondary | ICD-10-CM | POA: Diagnosis not present

## 2018-08-30 DIAGNOSIS — Z9181 History of falling: Secondary | ICD-10-CM | POA: Diagnosis not present

## 2018-09-02 DIAGNOSIS — I10 Essential (primary) hypertension: Secondary | ICD-10-CM | POA: Diagnosis not present

## 2018-09-02 DIAGNOSIS — E0789 Other specified disorders of thyroid: Secondary | ICD-10-CM | POA: Diagnosis not present

## 2018-09-02 DIAGNOSIS — E299 Testicular dysfunction, unspecified: Secondary | ICD-10-CM | POA: Diagnosis not present

## 2018-09-02 DIAGNOSIS — E039 Hypothyroidism, unspecified: Secondary | ICD-10-CM | POA: Diagnosis not present

## 2018-09-02 DIAGNOSIS — E038 Other specified hypothyroidism: Secondary | ICD-10-CM | POA: Diagnosis not present

## 2018-09-02 DIAGNOSIS — E119 Type 2 diabetes mellitus without complications: Secondary | ICD-10-CM | POA: Diagnosis not present

## 2018-09-02 DIAGNOSIS — R5383 Other fatigue: Secondary | ICD-10-CM | POA: Diagnosis not present

## 2018-09-02 DIAGNOSIS — E7800 Pure hypercholesterolemia, unspecified: Secondary | ICD-10-CM | POA: Diagnosis not present

## 2018-09-02 DIAGNOSIS — N419 Inflammatory disease of prostate, unspecified: Secondary | ICD-10-CM | POA: Diagnosis not present

## 2018-09-06 DIAGNOSIS — Z9181 History of falling: Secondary | ICD-10-CM | POA: Diagnosis not present

## 2018-09-06 DIAGNOSIS — R531 Weakness: Secondary | ICD-10-CM | POA: Diagnosis not present

## 2018-09-06 DIAGNOSIS — G231 Progressive supranuclear ophthalmoplegia [Steele-Richardson-Olszewski]: Secondary | ICD-10-CM | POA: Diagnosis not present

## 2018-09-06 DIAGNOSIS — R413 Other amnesia: Secondary | ICD-10-CM | POA: Diagnosis not present

## 2018-09-06 DIAGNOSIS — R471 Dysarthria and anarthria: Secondary | ICD-10-CM | POA: Diagnosis not present

## 2018-09-14 DIAGNOSIS — R531 Weakness: Secondary | ICD-10-CM | POA: Diagnosis not present

## 2018-09-14 DIAGNOSIS — R471 Dysarthria and anarthria: Secondary | ICD-10-CM | POA: Diagnosis not present

## 2018-09-14 DIAGNOSIS — G231 Progressive supranuclear ophthalmoplegia [Steele-Richardson-Olszewski]: Secondary | ICD-10-CM | POA: Diagnosis not present

## 2018-09-14 DIAGNOSIS — R413 Other amnesia: Secondary | ICD-10-CM | POA: Diagnosis not present

## 2018-11-01 DIAGNOSIS — G231 Progressive supranuclear ophthalmoplegia [Steele-Richardson-Olszewski]: Secondary | ICD-10-CM | POA: Diagnosis not present

## 2018-11-01 DIAGNOSIS — R42 Dizziness and giddiness: Secondary | ICD-10-CM | POA: Diagnosis not present

## 2018-11-08 DIAGNOSIS — G231 Progressive supranuclear ophthalmoplegia [Steele-Richardson-Olszewski]: Secondary | ICD-10-CM | POA: Diagnosis not present

## 2018-11-08 DIAGNOSIS — R42 Dizziness and giddiness: Secondary | ICD-10-CM | POA: Diagnosis not present

## 2018-11-15 DIAGNOSIS — G231 Progressive supranuclear ophthalmoplegia [Steele-Richardson-Olszewski]: Secondary | ICD-10-CM | POA: Diagnosis not present

## 2018-11-15 DIAGNOSIS — R42 Dizziness and giddiness: Secondary | ICD-10-CM | POA: Diagnosis not present

## 2018-11-22 DIAGNOSIS — G231 Progressive supranuclear ophthalmoplegia [Steele-Richardson-Olszewski]: Secondary | ICD-10-CM | POA: Diagnosis not present

## 2018-11-22 DIAGNOSIS — R42 Dizziness and giddiness: Secondary | ICD-10-CM | POA: Diagnosis not present

## 2018-11-29 DIAGNOSIS — R42 Dizziness and giddiness: Secondary | ICD-10-CM | POA: Diagnosis not present

## 2018-11-29 DIAGNOSIS — G231 Progressive supranuclear ophthalmoplegia [Steele-Richardson-Olszewski]: Secondary | ICD-10-CM | POA: Diagnosis not present

## 2018-12-10 DIAGNOSIS — F331 Major depressive disorder, recurrent, moderate: Secondary | ICD-10-CM | POA: Diagnosis not present

## 2018-12-10 DIAGNOSIS — E7800 Pure hypercholesterolemia, unspecified: Secondary | ICD-10-CM | POA: Diagnosis not present

## 2018-12-10 DIAGNOSIS — J309 Allergic rhinitis, unspecified: Secondary | ICD-10-CM | POA: Diagnosis not present

## 2018-12-10 DIAGNOSIS — I10 Essential (primary) hypertension: Secondary | ICD-10-CM | POA: Diagnosis not present

## 2019-04-05 ENCOUNTER — Encounter (HOSPITAL_COMMUNITY): Payer: Medicare HMO

## 2019-04-27 ENCOUNTER — Other Ambulatory Visit: Payer: Self-pay | Admitting: Neurology

## 2019-04-27 ENCOUNTER — Other Ambulatory Visit (HOSPITAL_COMMUNITY): Payer: Self-pay | Admitting: *Deleted

## 2019-04-27 DIAGNOSIS — R1310 Dysphagia, unspecified: Secondary | ICD-10-CM

## 2019-05-03 ENCOUNTER — Ambulatory Visit (HOSPITAL_COMMUNITY)
Admission: RE | Admit: 2019-05-03 | Discharge: 2019-05-03 | Disposition: A | Discharge status: Home / Self Care | Payer: Medicare HMO | Source: Ambulatory Visit | Attending: Neurology | Admitting: Neurology

## 2019-05-03 ENCOUNTER — Other Ambulatory Visit: Payer: Self-pay

## 2019-05-03 DIAGNOSIS — R471 Dysarthria and anarthria: Secondary | ICD-10-CM | POA: Insufficient documentation

## 2019-05-03 DIAGNOSIS — R1310 Dysphagia, unspecified: Secondary | ICD-10-CM | POA: Insufficient documentation

## 2019-05-03 DIAGNOSIS — G231 Progressive supranuclear ophthalmoplegia [Steele-Richardson-Olszewski]: Secondary | ICD-10-CM | POA: Insufficient documentation

## 2019-05-03 DIAGNOSIS — I10 Essential (primary) hypertension: Secondary | ICD-10-CM | POA: Insufficient documentation

## 2019-05-03 NOTE — Progress Notes (Signed)
Modified Barium Swallow Progress Note  Patient Details  Name: DAJAUN GOLDRING MRN: [id number] Date of Birth: [birth year]  Today's Date: 05/03/2019  Modified Barium Swallow completed.  Full report located under Chart Review in the Imaging Section.  Brief recommendations include the following:  Clinical Impression  Despite grossly functional swallow function, his risk for aspiration is likely increased by his impulsive rate of intake. He did not appear to fully masticate certain foods, like the softer peaches, before swallowing them. He had a single episode of trace aspiration of thin liquids that occurred when he did not complete swallow a straw sip of thin liquids. When he went to swallow the remaining liquid from his oral cavity, had small amounts of premature spillage with a trace amount reaching just beyond the vocal folds. It did elicit a reflexive, subtle cough, but he needed a verbal cue to increase his force to eject the aspirate. Otherwise, he had good airway protection across all consistencies and mixed boluses despite challenging. Recommend continuing regular solids and thin liquids but educated him about the importance of slower pacing and thorough mastication. Small bites/sips would also be helpful. Of note, pt also reports a h/o GER and several of his symptoms could also be related to this (trouble with solids, trouble with spicy foods, etc.). MD may wish to conisder additional f/u more dedicated to the esophagus.   Swallow Evaluation Recommendations   Recommended Consults: Consider esophageal assessment;Consider GI evaluation   SLP Diet Recommendations: Regular solids;Thin liquid   Liquid Administration via: Cup;Straw   Medication Administration: Whole meds with liquid   Supervision: Patient able to self feed   Compensations: Slow rate;Small sips/bites   Postural Changes: Seated upright at 90 degrees;Remain semi-upright after after feeds/meals (Comment)   Oral Care  Recommendations: Oral care BID        Venita Sheffield Redina Zeller 05/03/2019,3:25 PM   Pollyann Glen, M.A. [facility] Acute Environmental education officer [phone] Office [phone]

## 2019-09-10 NOTE — Progress Notes (Signed)
Telehealth Visit     Virtual Visit via Video Note   This visit type was conducted due to national recommendations for restrictions regarding the COVID-19 Pandemic (e.g. social distancing) in an effort to limit this patient's exposure and mitigate transmission in our community.  Due to his co-morbid illnesses, this patient is at least at moderate risk for complications without adequate follow up.  This format is felt to be most appropriate for this patient at this time.  All issues noted in this document were discussed and addressed.  A limited physical exam was performed with this format.  Please refer to the patient's chart for his consent to telehealth for Antonio Cisneros.   Evaluation Performed:  Follow-up visit  This visit type was conducted due to national recommendations for restrictions regarding the COVID-19 Pandemic (e.g. social distancing).  This format is felt to be most appropriate for this patient at this time.  All issues noted in this document were discussed and addressed.  No physical exam was performed (except for noted visual exam findings with Video Visits).  Please refer to the patient's chart (MyChart message for video visits and phone note for telephone visits) for the patient's consent to telehealth for Antonio Cisneros.  Date:  09/10/2019   ID:  Antonio Cisneros, DOB June 11, 1951, MRN [id number]  Patient Location:    Provider location:     PCP:  Marin Comment, FNP  Cardiologist:  Rick Duff Electrophysiologist:  None   Chief Complaint:  Follow up  History of Present Illness:    Antonio Cisneros is a 69 y.o. male who presents via audio/video conferencing for a telehealth visit today.  Seen for Dr. Anne Fu.   He was seen here in October of 2019 by Dr. Anne Fu due to abnormal carotid ultrasound. He has seen Dr. Shelva Majestic in the past - has had prior echo with normal LV function and trivial aortic regurgitation noted. Prior carotid ultrasound demonstrated plaque in right  and left carotid arteries, left carotid with stenosis.  Right internal carotid artery was less than 50%, homogeneous plaque.  Right vertebral artery is occluded.  Left internal carotid less than 50%, left external carotid 50 to 69%, left common carotid 30 to 49% stenosis. He has chronic dizziness - worse following prior sinus surgery. He has double vision chronically. Other issues include depression and PTSD.    He was referred to neurology by Dr. Anne Fu and follow up carotid duplex was ordered - not completed.   Unknown if the patient has symptoms concerning for COVID-19 infection (fever, chills, cough, or Antonio shortness of breath).   Attempted to see for virtual visit - he did not answer the phone despite multiple attempts.    Past Medical History:  Diagnosis Date  . Depression   . PTSD (post-traumatic stress disorder)    Past Surgical History:  Procedure Laterality Date  . ANKLE FRACTURE SURGERY    . broken finger    . SINUSOTOMY       No outpatient medications have been marked as taking for the 09/13/19 encounter (Appointment) with Rosalio Macadamia, NP.     Allergies:   Patient has no known allergies.   Social History   Tobacco Use  . Smoking status: Former Smoker    Packs/day: 1.50    Years: 25.00    Pack years: 37.50    Types: Cigarettes    Quit date: 1989    Years since quitting: 32.1  . Smokeless tobacco: Never Used  Substance Use  Topics  . Alcohol use: No  . Drug use: No     Family Hx: The patient's family history includes Alcohol abuse in his mother; Dementia in his father.  ROS:   Please see the history of present illness.   All other systems reviewed are negative except for .    Objective:    Vital Signs:  There were no vitals taken for this visit.   Wt Readings from Last 3 Encounters:  04/20/18 195 lb (88.5 kg)  04/15/18 194 lb 3.2 oz (88.1 kg)  09/25/17 198 lb 6.4 oz (90 kg)    Alert male in no acute distress.   Labs/Other Tests and Data  Reviewed:    No results found for: WBC, HGB, HCT, PLT, GLUCOSE, CHOL, TRIG, HDL, LDLDIRECT, LDLCALC, ALT, AST, NA, K, CL, CREATININE, BUN, CO2, TSH, PSA, INR, GLUF, HGBA1C, MICROALBUR     BNP (last 3 results) No results for input(s): BNP in the last 8760 hours.  ProBNP (last 3 results) No results for input(s): PROBNP in the last 8760 hours.    Prior CV studies:    The following studies were reviewed today:     ASSESSMENT & PLAN:    1. Carotid disease  2. Dizziness  3. HLD -   4. COVID-19 Education:   Patient Risk:   After full review of this patient's clinical status, I feel that they are at least moderate risk at this time.  Time:   Today, I have spent 0 minutes with the patient with telehealth technology discussing the above issues.     Medication Adjustments/Labs and Tests Ordered: Current medicines are reviewed at length with the patient today.  Concerns regarding medicines are outlined above.   Tests Ordered: No orders of the defined types were placed in this encounter.   Medication Changes: No orders of the defined types were placed in this encounter.   Disposition:    Patient is agreeable to this plan and will call if any problems develop in the interim.   Amie Critchley, NP  09/10/2019 5:27 PM    [facility]

## 2019-09-12 ENCOUNTER — Telehealth: Payer: Self-pay | Admitting: *Deleted

## 2019-09-12 NOTE — Telephone Encounter (Signed)

## 2019-09-13 ENCOUNTER — Other Ambulatory Visit: Payer: Self-pay

## 2019-09-13 ENCOUNTER — Telehealth: Payer: Self-pay | Admitting: *Deleted

## 2019-09-13 ENCOUNTER — Telehealth (INDEPENDENT_AMBULATORY_CARE_PROVIDER_SITE_OTHER): Payer: Medicare HMO | Admitting: Nurse Practitioner

## 2019-09-13 ENCOUNTER — Encounter: Payer: Self-pay | Admitting: Nurse Practitioner

## 2019-09-13 DIAGNOSIS — R42 Dizziness and giddiness: Secondary | ICD-10-CM

## 2019-09-13 DIAGNOSIS — I6523 Occlusion and stenosis of bilateral carotid arteries: Secondary | ICD-10-CM

## 2019-09-13 DIAGNOSIS — Z7189 Other specified counseling: Secondary | ICD-10-CM

## 2019-09-13 NOTE — Patient Instructions (Signed)
After Visit Summary:  We will be checking the following labs today -    Medication Instructions:    Continue with your current medicines.    If you need a refill on your cardiac medications before your next appointment, please call your pharmacy.     Testing/Procedures To Be Arranged:  N/A  Follow-Up:   See     At [facility], you and your health needs are our priority.  As part of our continuing mission to provide you with exceptional heart care, we have created designated Provider Care Teams.  These Care Teams include your primary Cardiologist (physician) and Advanced Practice Providers (APPs -  Physician Assistants and Nurse Practitioners) who all work together to provide you with the care you need, when you need it.  Special Instructions:  . Stay safe, stay home, wash your hands for at least 20 seconds and wear a mask when out in public.  . It was good to talk with you today.    Call the [facility] office at [phone] if you have any questions, problems or concerns.       

## 2019-09-13 NOTE — Telephone Encounter (Signed)
lvm X 3 for upcoming VT visit today.

## 2020-05-30 ENCOUNTER — Other Ambulatory Visit: Payer: Self-pay

## 2020-05-30 ENCOUNTER — Ambulatory Visit: Payer: Medicare HMO | Attending: Neurology | Admitting: Rehabilitation

## 2020-05-30 ENCOUNTER — Encounter: Payer: Self-pay | Admitting: Rehabilitation

## 2020-05-30 DIAGNOSIS — M542 Cervicalgia: Secondary | ICD-10-CM | POA: Insufficient documentation

## 2020-05-30 DIAGNOSIS — R2681 Unsteadiness on feet: Secondary | ICD-10-CM | POA: Diagnosis present

## 2020-05-30 DIAGNOSIS — R42 Dizziness and giddiness: Secondary | ICD-10-CM | POA: Diagnosis present

## 2020-05-30 DIAGNOSIS — R296 Repeated falls: Secondary | ICD-10-CM | POA: Diagnosis present

## 2020-05-30 DIAGNOSIS — R293 Abnormal posture: Secondary | ICD-10-CM | POA: Diagnosis present

## 2020-05-30 DIAGNOSIS — R2689 Other abnormalities of gait and mobility: Secondary | ICD-10-CM | POA: Diagnosis present

## 2020-05-30 NOTE — Therapy (Signed)
[facility] [address] [place], [place], [address] Phone: [phone]   Fax:  [phone]  Physical Therapy Evaluation  Patient Details  Name: Antonio Cisneros MRN: [id number] Date of Birth: 1950/09/28 Referring Provider (PT): Morene Rankins, MD   Encounter Date: 05/30/2020   PT End of Session - 05/30/20 1821    Visit Number 1    Number of Visits 17    Date for PT Re-Evaluation 44/96/75   cert for 90 days, POC for 60 days   Authorization Type [employer] (auth needed and 10th visit PN needed)    Progress Note Due on Visit 10    PT Start Time 1703    PT Stop Time 1748    PT Time Calculation (min) 45 min    Activity Tolerance Patient tolerated treatment well    Behavior During Therapy [facility] for tasks assessed/performed;Impulsive           Past Medical History:  Diagnosis Date  . Depression   . PTSD (post-traumatic stress disorder)     Past Surgical History:  Procedure Laterality Date  . ANKLE FRACTURE SURGERY    . broken finger    . SINUSOTOMY      There were no vitals filed for this visit.    Subjective Assessment - 05/30/20 1707    Subjective Pt reports dizziness and light sensitivity and so has to wear sunglasses all the time.  Also has trouble looking up/down.  Reports having backwards falls and is unable to catch himself when falling back.  Has "no balance".    Patient is accompained by: Family member   [name]   Limitations Standing;Walking;House hold activities    How long can you stand comfortably? Less than 30 mins    How long can you walk comfortably? 5-10 mins    Patient Stated Goals "I'd like to stop this dizziheadedness"    Currently in Pain? Yes    Pain Score 2     Pain Location Neck    Pain Orientation Mid;Upper    Pain Descriptors / Indicators Aching    Pain Type Chronic pain    Pain Onset More than a month ago    Pain Frequency Constant    Aggravating Factors  lying flat    Pain  Relieving Factors raise up/double up pillow              [facility] PT Assessment - 05/30/20 1717      Assessment   Medical Diagnosis Progressive Supranuclear Palsy    Referring Provider (PT) Morene Rankins, MD    Onset Date/Surgical Date --   Diagnosis in 2019   Prior Therapy Had PT in the past       Precautions   Precautions Fall      Balance Screen   Has the patient fallen in the past 6 months Yes    How many times? 3   per day (approx)   Has the patient had a decrease in activity level because of a fear of falling?  Yes    Is the patient reluctant to leave their home because of a fear of falling?  Yes      Home Environment   Living Environment Private residence    Living Arrangements Spouse/significant other    Available Help at Discharge Family;Available PRN/intermittently   Wife works 8-afternoon   Type of Home House    Home Access Stairs to enter    [employer] of Steps 3  Entrance Stairs-Rails Right    Home Layout Two level;Bed/bath upstairs    Alternate Level Stairs-Number of Steps 13    Alternate Level Stairs-Rails Right;Left;Can reach both    Home Equipment Cane - single point;Grab bars - tub/shower   walk in shower with small lip      Prior Function   Level of Independence Independent with basic ADLs    Vocation Retired    Leisure Like to get out in the yard and garden       Cognition   Behaviors Impulsive      Sensation   Light Touch Appears Intact    Hot/Cold Appears Intact      Coordination   Gross Motor Movements are Fluid and Coordinated Yes   gross motor in LEs   Heel Shin Test Grossly WFL, decreased ROM noted       Posture/Postural Control   Posture/Postural Control Postural limitations    Postural Limitations Rounded Shoulders;Forward head;Decreased lumbar lordosis;Posterior pelvic tilt;Flexed trunk;Increased thoracic kyphosis    Posture Comments Did not take formal neck ROM measurements, but pts spine very stiff and pt with pain in  upper cervical region.  Would like to address in future sessions.       ROM / Strength   AROM / PROM / Strength Strength      Strength   Overall Strength Within functional limits for tasks performed   in seated  position     Transfers   Transfers Sit to Stand;Stand to Sit    Sit to Stand 4: Min assist    Sit to Stand Details Verbal cues for sequencing;Verbal cues for technique;Manual facilitation for weight shifting    Sit to Stand Details (indicate cue type and reason) Cues and facilitation for forward trunk lean and weight shift.     Five time sit to stand comments  25.34 secs with BUE support, marked posterior LOB with min A to prevent fall backwards     Stand to Sit 4: Min assist    Stand to Sit Details (indicate cue type and reason) Verbal cues for sequencing;Verbal cues for technique;Manual facilitation for weight shifting      Ambulation/Gait   Ambulation/Gait Yes    Ambulation/Gait Assistance 4: Min guard    Ambulation Distance (Feet) 150 Feet    Assistive device None    Gait Pattern Step-through pattern;Decreased arm swing - right;Decreased arm swing - left;Decreased stride length;Right flexed knee in stance;Left flexed knee in stance;Decreased trunk rotation;Trunk flexed    Ambulation Surface Level;Indoor    Gait velocity 3.33 ft/sec with min/guard A for safety and visual defiicts     Gait Comments Push/release test requires 6-7 mini steps but he is able to recover without assist.       Standardized Balance Assessment   Standardized Balance Assessment Timed Up and Go Test      Timed Up and Go Test   TUG Normal TUG;Cognitive TUG    Normal TUG (seconds) 14.69    Cognitive TUG (seconds) 24.5    TUG Comments Needs min A when standing to prevent posterior LOB                      Objective measurements completed on examination: See above findings.               PT Education - 05/30/20 1821    Education Details Education on evaluation results, POC,  goals.    Person(s) Educated Patient;Spouse  Methods Explanation    Comprehension Verbalized understanding            PT Short Term Goals - 05/30/20 1836      PT SHORT TERM GOAL #1   Title Pt will be IND with initial HEP in order to indicate improved functional mobility and dec fall risk.  (Target Date: 06/29/20)    Time 4    Period Weeks    Status New    Target Date 06/29/20      PT SHORT TERM GOAL #2   Title Pt will improve TUG to </=13.50 secs with improved forward weight shift during sit>stand in order to indicate dec fall risk.    Time 4    Period Weeks    Status New      PT SHORT TERM GOAL #3   Title Pt will improve 5TSS to </=21 secs with support and improved forward weight shift in order to indicate dec fall risk.    Time 4    Period Weeks    Status New      PT SHORT TERM GOAL #4   Title Pt will ambulate with gait speed of >/=2.62 ft/sec w/ LRAD at mod I level with improved posture in order to indicate safe household and limited community mobility.    Time 4    Period Weeks    Status New      PT SHORT TERM GOAL #5   Title Will assess cervical spine and vestibular deficits and write appropriate LTGs when able.    Time 4    Period Weeks    Status New      Additional Short Term Goals   Additional Short Term Goals Yes      PT SHORT TERM GOAL #6   Title Will assess DGI and update STG/add LTG as appropriate to indicate dec fall risk.    Time 4    Period Weeks    Status New             PT Long Term Goals - 05/30/20 1840      PT LONG TERM GOAL #1   Title Pt will be IND with final HEP in order to indicate improved functional mobility and dec fall risk.  (Target Date: 07/29/20)    Time 8    Period Weeks    Status New    Target Date 07/29/20      PT LONG TERM GOAL #2   Title Pt will improve cognitive TUG to </=18 secs with improved sit>stand (LRAD as needed) in order to indicate dec fall risk.    Time 8    Period Weeks    Status New      PT LONG  TERM GOAL #3   Title Pt will improve 5TSS to >/=17 secs with improved sit>stand (to device if needed) with support in order to indicate dec fall risk.    Time 8    Period Weeks    Status New      PT LONG TERM GOAL #4   Title Pt will perform push/release test with no more than 2 steps in order to indicate improved balance recovery with posterior LOB.    Time 8    Period Weeks    Status New                  Plan - 05/30/20 1822    Clinical Impression Statement Pt is a pleasant 69 y/o male with diagnosis of Progressive Supranuclear Palsy (  dx in 2019) with history of depression, HTN, arthritis and PTSD.  Upon PT evaluation, note very stiff spine with forward flexed posutre, rounded shoulders and forward head.  He has received PT for neck pain in past and has some degenerative changes in spine but does have constant pain.  He reports he is falling up to 3 times per day and always backwards.  He has light sensitivity and constant dizziness which are his biggest complaints.  This may be a combination of visual/vestibular deficits vs hypofunction vs him feeling unsteady.  Gait speed was 3.33 ft/sec without AD however did provide min/guard A for steadying and due to visual deficits.  Performed TUG in 14.69 secs and cognitive TUG in 24.50 secs both indicative of increased fall risk and needing min A to prevent posterior LOB when standing.   Push/release test required several (6-7) steps to recover balance and 5TSS of 25.34 secs with BUE support and min A needed to prevent posterior LOB.  Pt will benefit from skilled OP neuro PT in order to address deficits.    Personal Factors and Comorbidities Comorbidity 3+;Time since onset of injury/illness/exacerbation    Comorbidities see above    Examination-Activity Limitations Locomotion Level;Reach Overhead;Lift;Transfers;Squat;Stairs;Stand    Examination-Participation Restrictions Cleaning;Community Activity;Yard Work    Conservation officer, historic buildingstability/Clinical Decision Making  Evolving/Moderate complexity    Clinical Decision Making Moderate    Rehab Potential Fair    PT Frequency 2x / week    PT Duration 8 weeks    PT Treatment/Interventions ADLs/Self Care Home Management;Aquatic Therapy;Moist Heat;DME Instruction;Gait training;Stair training;Functional mobility training;Therapeutic activities;Therapeutic exercise;Balance training;Neuromuscular re-education;Cognitive remediation;Patient/family education;Manual techniques;Passive range of motion;Dry needling;Energy conservation;Taping;Vestibular;Visual/perceptual remediation/compensation    PT Next Visit Plan Do DGI and update goal if needed, look at gait/mobility with RW (they do have one at home), provide HEP for sit<>stand, balance and forward weight shifting, balance compensatory strategies, assess cervical spine when able and address vestibular deficits.    Recommended Other Services PT to fax request for SLP and OT, keep a check on this    Consulted and Agree with Plan of Care Patient;Family member/caregiver    Family Member Consulted wife Lamar LaundrySonya           Patient will benefit from skilled therapeutic intervention in order to improve the following deficits and impairments:  Abnormal gait, Decreased activity tolerance, Decreased balance, Decreased cognition, Decreased endurance, Decreased knowledge of precautions, Decreased knowledge of use of DME, Decreased range of motion, Decreased safety awareness, Difficulty walking, Dizziness, Hypomobility, Impaired perceived functional ability, Impaired flexibility, Impaired vision/preception, Postural dysfunction  Visit Diagnosis: Unsteadiness on feet  Repeated falls  Abnormal posture  Other abnormalities of gait and mobility  Cervicalgia  Dizziness and giddiness     Problem List Patient Active Problem List   Diagnosis Date Noted  . Physical deconditioning 07/02/2018  . Evaluation by psychiatric service required 06/22/2018  . History of depression  06/22/2018  . History of posttraumatic stress disorder (PTSD) 06/22/2018  . AKI (acute kidney injury) (HCC) 06/21/2018  . Allergic rhinitis 06/21/2018  . Dizziness 06/21/2018  . Dysphagia 06/21/2018  . Hypertension 06/21/2018  . Hand pain, right 01/29/2017  . Extensor tendon laceration of finger with open wound 01/29/2017  . Open displaced fracture of middle phalanx of left index finger with nonunion 10/22/2016  . Chronic pansinusitis 10/08/2016  . Nasal septal deviation 08/26/2016  . Nasal turbinate hypertrophy 08/26/2016  . Rhinorrhea 08/26/2016    Harriet ButteEmily Tirrell Buchberger, PT, MPT [facility] [address] [place], [place], [address] Phone: [phone]   Fax:  [phone] 05/30/20, 6:44 PM  Name: TYSHON FANNING MRN: [id number] Date of Birth: [birth year]

## 2020-06-05 ENCOUNTER — Other Ambulatory Visit: Payer: Self-pay

## 2020-06-05 ENCOUNTER — Encounter: Payer: Self-pay | Admitting: Physical Therapy

## 2020-06-05 ENCOUNTER — Ambulatory Visit: Payer: Medicare HMO | Admitting: Physical Therapy

## 2020-06-05 DIAGNOSIS — R293 Abnormal posture: Secondary | ICD-10-CM

## 2020-06-05 DIAGNOSIS — R296 Repeated falls: Secondary | ICD-10-CM

## 2020-06-05 DIAGNOSIS — R2681 Unsteadiness on feet: Secondary | ICD-10-CM

## 2020-06-05 DIAGNOSIS — R2689 Other abnormalities of gait and mobility: Secondary | ICD-10-CM

## 2020-06-06 NOTE — Therapy (Signed)
[facility] [address] [place], [place], [address] Phone: [phone]   Fax:  [phone]  Physical Therapy Treatment  Patient Details  Name: Antonio Cisneros MRN: [id number] Date of Birth: 11/25/1950 Referring Provider (PT): Morene Rankins, MD   Encounter Date: 06/05/2020   PT End of Session - 06/06/20 0848    Visit Number 2    Number of Visits 17    Date for PT Re-Evaluation 85/27/78   cert for 90 days, POC for 60 days   Authorization Type [employer] (auth needed and 10th visit PN needed)    Progress Note Due on Visit 10    PT Start Time 1704    PT Stop Time 1745    PT Time Calculation (min) 41 min    Equipment Utilized During Treatment Gait belt    Activity Tolerance Patient tolerated treatment well    Behavior During Therapy [facility] for tasks assessed/performed;Impulsive           Past Medical History:  Diagnosis Date  . Depression   . PTSD (post-traumatic stress disorder)     Past Surgical History:  Procedure Laterality Date  . ANKLE FRACTURE SURGERY    . broken finger    . SINUSOTOMY      There were no vitals filed for this visit.   Subjective Assessment - 06/05/20 1707    Subjective Has only had 3 or 4 falls since he was last here - able to get up and down. States he is always dizzy - reports nothing makes the dizziness worse. Reports not being able to see the floor when he is walking.    Patient is accompained by: Family member   [name]   Limitations Standing;Walking;House hold activities    How long can you stand comfortably? Less than 30 mins    How long can you walk comfortably? 5-10 mins    Patient Stated Goals "I'd like to stop this dizziheadedness"    Currently in Pain? Yes    Pain Score 4     Pain Location Neck    Pain Orientation Upper;Mid    Pain Descriptors / Indicators Aching    Pain Type Chronic pain    Pain Onset More than a month ago    Aggravating Factors  "not sure"    Pain  Relieving Factors "not really - just hurts all the time"                             [facility] Adult PT Treatment/Exercise - 06/06/20 0001      Transfers   Transfers Sit to Stand;Stand to Sit    Sit to Stand 4: Min guard;4: Min assist    Sit to Stand Details Verbal cues for technique;Verbal cues for precautions/safety;Verbal cues for sequencing;Visual cues/gestures for sequencing    Stand to Sit 4: Min guard;4: Min assist    Stand to Sit Details (indicate cue type and reason) Verbal cues for sequencing;Verbal cues for technique    Comments prior to sit <> stands, performed seated at edge of mat: performed trunk flexion and back to upright x10 reps for incr anterior weight shift to prep for sit <> stands with cues for tall posture, performed massed practice of sit <> Stands from mat table, pt impulsive at times, needed cues first to scoot towards edge, have feet under him and perform with big nose over toes, initial reps when pt performs without cues  needing min A because of pt losing balance backwards posteriorly into mat. Currently pt's wife at home is pulling him out of the chair. When pt using BUE support and following cues, pt able to demo improved balance and no retropulsion when standing up. When pt trying with hands on knees, pt with incr retropulsion back into mat table due to being unable to keep weight anteriorly. Also performed with pt seated on blue open foam for incr compliant surface as to mimic pt's recliner. Taught pt's wife proper cueing for technique with pt on how to perform safely/proper sequencing at home to decr retropulsion, with pt's spouse able to verbalize understanding after multiple reps.      Ambulation/Gait   Ambulation/Gait Yes    Ambulation/Gait Assistance 4: Min assist    Ambulation/Gait Assistance Details ambulating in and out of session with pt holding onto therapist's arm for balance, pt reports that he can't see the ground when he is walking      Ambulation Distance (Feet) --   in and out of clinic   Assistive device None    Gait Pattern Step-through pattern;Decreased arm swing - right;Decreased arm swing - left;Decreased stride length;Right flexed knee in stance;Left flexed knee in stance;Decreased trunk rotation;Trunk flexed    Ambulation Surface Level;Indoor      Self-Care   Self-Care Other Self-Care Comments    Other Self-Care Comments  disccused with pt and pt's wife about life alert as pt is home alone while pt's wife is at work M-F and has multiple falls a day for increased safety, pt's wife in agreement to look into it. Also discussed use of a gait belt for wife to use for pt for safety for assist with balance when pt ambulating and when performing transfers, showed pt's wife where to purchase one online off [place] and proper hand placement for safety. Asked pt's spouse if they have a transfer chair/w/c for safety with community distances (such as coming into therapy), pt's spouse states they don't have one but received an order from [facility] to go pick one up. Therapist stating that they did not see one in the chart, and that pt's spouse could reach out to [facility] about receiving that order to be picked up                  PT Education - 06/06/20 0847    Education Details see self care, sit <> stand training with pt's spouse, rescheduling appts    Person(s) Educated Patient;Spouse    Methods Explanation;Verbal cues;Demonstration    Comprehension Returned demonstration;Verbalized understanding;Verbal cues required            PT Short Term Goals - 05/30/20 1836      PT SHORT TERM GOAL #1   Title Pt will be IND with initial HEP in order to indicate improved functional mobility and dec fall risk.  (Target Date: 06/29/20)    Time 4    Period Weeks    Status New    Target Date 06/29/20      PT SHORT TERM GOAL #2   Title Pt will improve TUG to </=13.50 secs with improved forward weight shift during sit>stand in  order to indicate dec fall risk.    Time 4    Period Weeks    Status New      PT SHORT TERM GOAL #3   Title Pt will improve 5TSS to </=21 secs with support and improved forward weight shift in order to  indicate dec fall risk.    Time 4    Period Weeks    Status New      PT SHORT TERM GOAL #4   Title Pt will ambulate with gait speed of >/=2.62 ft/sec w/ LRAD at mod I level with improved posture in order to indicate safe household and limited community mobility.    Time 4    Period Weeks    Status New      PT SHORT TERM GOAL #5   Title Will assess cervical spine and vestibular deficits and write appropriate LTGs when able.    Time 4    Period Weeks    Status New      Additional Short Term Goals   Additional Short Term Goals Yes      PT SHORT TERM GOAL #6   Title Will assess DGI and update STG/add LTG as appropriate to indicate dec fall risk.    Time 4    Period Weeks    Status New             PT Long Term Goals - 05/30/20 1840      PT LONG TERM GOAL #1   Title Pt will be IND with final HEP in order to indicate improved functional mobility and dec fall risk.  (Target Date: 07/29/20)    Time 8    Period Weeks    Status New    Target Date 07/29/20      PT LONG TERM GOAL #2   Title Pt will improve cognitive TUG to </=18 secs with improved sit>stand (LRAD as needed) in order to indicate dec fall risk.    Time 8    Period Weeks    Status New      PT LONG TERM GOAL #3   Title Pt will improve 5TSS to >/=17 secs with improved sit>stand (to device if needed) with support in order to indicate dec fall risk.    Time 8    Period Weeks    Status New      PT LONG TERM GOAL #4   Title Pt will perform push/release test with no more than 2 steps in order to indicate improved balance recovery with posterior LOB.    Time 8    Period Weeks    Status New                 Plan - 06/06/20 0850    Clinical Impression Statement Focus of today's skilled session was sit <>  stand training to help prevent retropulsion with spouse present. When properly cued and with correct technique, pt able to perform without losing balance when using BUE support. Pt needing min A from therapist ambulating in and out of clinic with no AD (pt needing to hold onto therapist's arm for balance. Discussed safety measures with pt's spouse at home including getting pt a life alert since pt is at home by himself while his wife is at work. Will continue to progress towards LTGs.    Personal Factors and Comorbidities Comorbidity 3+;Time since onset of injury/illness/exacerbation    Comorbidities see above    Examination-Activity Limitations Locomotion Level;Reach Overhead;Lift;Transfers;Squat;Stairs;Stand    Examination-Participation Restrictions Cleaning;Community Activity;Yard Work    Conservation officer, historic buildingstability/Clinical Decision Making Evolving/Moderate complexity    Rehab Potential Fair    PT Frequency 2x / week    PT Duration 8 weeks    PT Treatment/Interventions ADLs/Self Care Home Management;Aquatic Therapy;Moist Heat;DME Instruction;Gait training;Stair training;Functional mobility training;Therapeutic activities;Therapeutic exercise;Balance training;Neuromuscular  re-education;Cognitive remediation;Patient/family education;Manual techniques;Passive range of motion;Dry needling;Energy conservation;Taping;Vestibular;Visual/perceptual remediation/compensation    PT Next Visit Plan look at gait/mobility with RW or try U Step Walker(they do have one at home), initial HEP for anterior weight shift/postural exercises, assess cervical spine when able and address vestibular deficits. fall recovery training with spouse.    Consulted and Agree with Plan of Care Patient;Family member/caregiver    Family Member Consulted wife Lamar Laundry           Patient will benefit from skilled therapeutic intervention in order to improve the following deficits and impairments:  Abnormal gait, Decreased activity tolerance, Decreased  balance, Decreased cognition, Decreased endurance, Decreased knowledge of precautions, Decreased knowledge of use of DME, Decreased range of motion, Decreased safety awareness, Difficulty walking, Dizziness, Hypomobility, Impaired perceived functional ability, Impaired flexibility, Impaired vision/preception, Postural dysfunction  Visit Diagnosis: Unsteadiness on feet  Repeated falls  Abnormal posture  Other abnormalities of gait and mobility     Problem List Patient Active Problem List   Diagnosis Date Noted  . Physical deconditioning 07/02/2018  . Evaluation by psychiatric service required 06/22/2018  . History of depression 06/22/2018  . History of posttraumatic stress disorder (PTSD) 06/22/2018  . AKI (acute kidney injury) (HCC) 06/21/2018  . Allergic rhinitis 06/21/2018  . Dizziness 06/21/2018  . Dysphagia 06/21/2018  . Hypertension 06/21/2018  . Hand pain, right 01/29/2017  . Extensor tendon laceration of finger with open wound 01/29/2017  . Open displaced fracture of middle phalanx of left index finger with nonunion 10/22/2016  . Chronic pansinusitis 10/08/2016  . Nasal septal deviation 08/26/2016  . Nasal turbinate hypertrophy 08/26/2016  . Rhinorrhea 08/26/2016    Drake Leach, PT, DPT 06/06/2020, 8:57 AM  [facility] [address] [place], [place], [address] Phone: [phone]   Fax:  [phone]  Name: Antonio Cisneros MRN: [id number] Date of Birth: 08-27-1950

## 2020-06-11 ENCOUNTER — Ambulatory Visit: Payer: Medicare HMO | Admitting: Physical Therapy

## 2020-06-13 ENCOUNTER — Ambulatory Visit: Payer: Medicare HMO | Admitting: Occupational Therapy

## 2020-06-13 ENCOUNTER — Other Ambulatory Visit: Payer: Self-pay

## 2020-06-13 ENCOUNTER — Ambulatory Visit: Payer: Medicare HMO | Attending: Neurology | Admitting: Physical Therapy

## 2020-06-13 DIAGNOSIS — R293 Abnormal posture: Secondary | ICD-10-CM | POA: Diagnosis present

## 2020-06-13 DIAGNOSIS — M542 Cervicalgia: Secondary | ICD-10-CM | POA: Insufficient documentation

## 2020-06-13 DIAGNOSIS — R2681 Unsteadiness on feet: Secondary | ICD-10-CM | POA: Diagnosis present

## 2020-06-13 DIAGNOSIS — R2689 Other abnormalities of gait and mobility: Secondary | ICD-10-CM | POA: Diagnosis present

## 2020-06-15 NOTE — Therapy (Signed)
[facility] [address] [place], [place], [address] Phone: [phone]   Fax:  [phone]  Physical Therapy Treatment  Patient Details  Name: Antonio Cisneros MRN: [id number] Date of Birth: [birth year] Referring Provider (PT): Morene Rankins, MD   Encounter Date: 06/13/2020     06/13/20 1700  Symptoms/Limitations  Subjective Wife present; he's had a few more falls.  Pt reports sometimes tripping over his feet, always fall backwards.  When PT asked about the dizziness, he reports it is more imbalance. (not room spinning)  Patient is accompained by: Family member [name]  Limitations Standing;Walking;House hold activities  How long can you stand comfortably? Less than 30 mins  How long can you walk comfortably? 5-10 mins  Patient Stated Goals "I'd like to stop this dizziheadedness"  Pain Assessment  Currently in Pain? Yes  Pain Score 4  Pain Location Neck  Pain Orientation Upper;Mid  Pain Descriptors / Indicators Aching  Pain Type Chronic pain  Pain Onset More than a month ago  Pain Frequency Constant  Aggravating Factors  not sure  Pain Relieving Factors not really; just hurts all the time    Past Medical History:  Diagnosis Date  . Depression   . PTSD (post-traumatic stress disorder)     Past Surgical History:  Procedure Laterality Date  . ANKLE FRACTURE SURGERY    . broken finger    . SINUSOTOMY      There were no vitals filed for this visit.      06/13/20 1700  PT Visits / Re-Eval  Visit Number 3  Number of Visits 17  Date for PT Re-Evaluation 05/69/79 (cert for 90 days, POC for 60 days)  Authorization  Authorization Type [employer] (auth needed and 10th visit PN needed)  Progress Note Due on Visit 10  PT Time Calculation  PT Start Time 1700  PT Stop Time 1747  PT Time Calculation (min) 47 min  PT - End of Session  Equipment Utilized During Treatment Gait belt  Activity Tolerance  Patient tolerated treatment well  Behavior During Therapy WFL for tasks assessed/performed;Impulsive    NMR: Worked on sit<>stand:   Performed massed practice of sit <> Stands from mat table, pt impulsive at times, needed cues first to scoot towards edge, tuck feet under him and perform with big nose over toes, initial reps when pt performs without cues needing min A because of pt losing balance backwards posteriorly, pushing legs into mat.   PT provitdes cues and assist for SLOWED forward lean, anterior weightshift over toes, then SLOWED transition to stand upright.  When pt using BUE support and following cues, pt able to demo improved balance and no retropulsion when standing up.  Tried (with >50% success of no posterior lean) forward lean with hands propelled forward to assist with forward momentum to initiate sit<>stand.   Also performed with pt seated on 14" block and BOSU for incr compliant surface as to mimic pt's recliner. Cues for increased forward lean, use of UEs forward for increased momentum and slowed forward motion to upright posture.  Educated pt's wife proper cueing for technique with pt on how to perform safely/proper sequencing at home to decr retropulsion, with pt's spouse able to verbalize understanding after multiple reps.  She does report he has dificulty with scooting to edge of recliner due to rocking component, so on edge of mat, practice lateral weightshifting and scooting up to and back from edge of mat.  Worked on initiation  of gait: Wife reports patient stands up and starts moving too fast.  PT provides education on technique and then practiced wide BOS lateral weightshifting prior to initiating gait.  With several reps of practice, pt needs tactile and verbal cues each time.  Short distance gait x 8-10 ft, with turn to return back to mat, pt with narrow BOS/near scissoring with turning.  Worked on turns: With short distance gait, worked on wide-U turns and figure-8 turns  for widened BOS and more space for turn, x 2 reps each with tactile and verbal cues. For tighter turns (at counter) worked on weightshifting/rocking turns-pt has significant difficulty with lifting feet to turn using this method At counter, worked on side step and weightshift x 10 reps each direction, tactile cues at hips for slowed, deliberate motion; then progressed to quarter turn, using image of 12:00<>3:00 for turns to R/midline, then 12:00<>9:00 for turns to L/midline.  Practiced at least 5 reps each side with tactile cues at hips, then added to turn to 6:00 to initiate gait.  Pt responds well to this technique, but needs tactile and verbal cues for reminders each rep.        06/13/20 1705  Plan  Clinical Impression Statement Skilled PT session today addressed review and continued practice for sit<>stand training.  Pt continues to be impulsive with movement patterns and worked on strategies/education to wife on ways to slow and improve movement patterns with sit<>stand, initiating gait, turning.  He will continues to benefit from skilled PT to work towards goals of improve mobility and safety and decreased fall risk.  Personal Factors and Comorbidities Comorbidity 3+;Time since onset of injury/illness/exacerbation  Comorbidities see above  Examination-Activity Limitations Locomotion Level;Reach Overhead;Lift;Transfers;Squat;Stairs;Stand  Examination-Participation Restrictions Cleaning;Community Activity;Yard Work  Pt will benefit from skilled therapeutic intervention in order to improve on the following deficits Abnormal gait;Decreased activity tolerance;Decreased balance;Decreased cognition;Decreased endurance;Decreased knowledge of precautions;Decreased knowledge of use of DME;Decreased range of motion;Decreased safety awareness;Difficulty walking;Dizziness;Hypomobility;Impaired perceived functional ability;Impaired flexibility;Impaired vision/preception;Postural dysfunction   Stability/Clinical Decision Making Evolving/Moderate complexity  Rehab Potential Fair  PT Frequency 2x / week  PT Duration 8 weeks  PT Treatment/Interventions ADLs/Self Care Home Management;Aquatic Therapy;Moist Heat;DME Instruction;Gait training;Stair training;Functional mobility training;Therapeutic activities;Therapeutic exercise;Balance training;Neuromuscular re-education;Cognitive remediation;Patient/family education;Manual techniques;Passive range of motion;Dry needling;Energy conservation;Taping;Vestibular;Visual/perceptual remediation/compensation  PT Next Visit Plan Review HEP from this visit; look at gait/mobility with RW or try U Step Walker(they do have one at home), initial HEP for anterior weight shift/postural exercises, assess cervical spine when able and address vestibular deficits. fall recovery training with spouse.  Consulted and Agree with Plan of Care Patient;Family member/caregiver  Family Member Consulted wife Antonio Cisneros                     PT Education - 06/15/20 0725    Education Details Initiated HEP-see instructions    Person(s) Educated Patient    Methods Explanation;Demonstration;Verbal cues;Handout    Comprehension Verbalized understanding;Returned demonstration;Verbal cues required            PT Short Term Goals - 05/30/20 1836      PT SHORT TERM GOAL #1   Title Pt will be IND with initial HEP in order to indicate improved functional mobility and dec fall risk.  (Target Date: 06/29/20)    Time 4    Period Weeks    Status New    Target Date 06/29/20      PT SHORT TERM GOAL #2   Title Pt will improve TUG to </=  13.50 secs with improved forward weight shift during sit>stand in order to indicate dec fall risk.    Time 4    Period Weeks    Status New      PT SHORT TERM GOAL #3   Title Pt will improve 5TSS to </=21 secs with support and improved forward weight shift in order to indicate dec fall risk.    Time 4    Period Weeks    Status New       PT SHORT TERM GOAL #4   Title Pt will ambulate with gait speed of >/=2.62 ft/sec w/ LRAD at mod I level with improved posture in order to indicate safe household and limited community mobility.    Time 4    Period Weeks    Status New      PT SHORT TERM GOAL #5   Title Will assess cervical spine and vestibular deficits and write appropriate LTGs when able.    Time 4    Period Weeks    Status New      Additional Short Term Goals   Additional Short Term Goals Yes      PT SHORT TERM GOAL #6   Title Will assess DGI and update STG/add LTG as appropriate to indicate dec fall risk.    Time 4    Period Weeks    Status New             PT Long Term Goals - 05/30/20 1840      PT LONG TERM GOAL #1   Title Pt will be IND with final HEP in order to indicate improved functional mobility and dec fall risk.  (Target Date: 07/29/20)    Time 8    Period Weeks    Status New    Target Date 07/29/20      PT LONG TERM GOAL #2   Title Pt will improve cognitive TUG to </=18 secs with improved sit>stand (LRAD as needed) in order to indicate dec fall risk.    Time 8    Period Weeks    Status New      PT LONG TERM GOAL #3   Title Pt will improve 5TSS to >/=17 secs with improved sit>stand (to device if needed) with support in order to indicate dec fall risk.    Time 8    Period Weeks    Status New      PT LONG TERM GOAL #4   Title Pt will perform push/release test with no more than 2 steps in order to indicate improved balance recovery with posterior LOB.    Time 8    Period Weeks    Status New                  Patient will benefit from skilled therapeutic intervention in order to improve the following deficits and impairments:  Abnormal gait, Decreased activity tolerance, Decreased balance, Decreased cognition, Decreased endurance, Decreased knowledge of precautions, Decreased knowledge of use of DME, Decreased range of motion, Decreased safety awareness, Difficulty walking,  Dizziness, Hypomobility, Impaired perceived functional ability, Impaired flexibility, Impaired vision/preception, Postural dysfunction  Visit Diagnosis: Other abnormalities of gait and mobility  Unsteadiness on feet     Problem List Patient Active Problem List   Diagnosis Date Noted  . Physical deconditioning 07/02/2018  . Evaluation by psychiatric service required 06/22/2018  . History of depression 06/22/2018  . History of posttraumatic stress disorder (PTSD) 06/22/2018  . AKI (acute kidney injury) (HCC)  06/21/2018  . Allergic rhinitis 06/21/2018  . Dizziness 06/21/2018  . Dysphagia 06/21/2018  . Hypertension 06/21/2018  . Hand pain, right 01/29/2017  . Extensor tendon laceration of finger with open wound 01/29/2017  . Open displaced fracture of middle phalanx of left index finger with nonunion 10/22/2016  . Chronic pansinusitis 10/08/2016  . Nasal septal deviation 08/26/2016  . Nasal turbinate hypertrophy 08/26/2016  . Rhinorrhea 08/26/2016    Clevon Khader W. 06/15/2020, 7:32 AM Gean MaidensMARRIOTT,Vanderbilt Ranieri W., PT d [facility] [address] [place], [place], [address] Phone: [phone]   Fax:  [phone]  Name: Antonio Cisneros MRN: [id number] Date of Birth: [birth year]

## 2020-06-15 NOTE — Patient Instructions (Signed)
Access Code: [email or web address] URL: [email or web address] Date: 06/15/2020 Prepared by: Lonia Blood  Exercises Side to side weightshift - 1-2 x daily - 7 x weekly - 1 sets - 10 reps Standing Quarter Turn with Counter Support - 1 x daily - 7 x weekly - 1 sets - 10 reps   Also provided handout (LSVT BIG picture) for forward lean with hands forward for sit<>stand

## 2020-06-18 ENCOUNTER — Ambulatory Visit: Payer: Medicare HMO | Admitting: Physical Therapy

## 2020-06-20 ENCOUNTER — Other Ambulatory Visit: Payer: Self-pay

## 2020-06-20 ENCOUNTER — Ambulatory Visit: Payer: Medicare HMO | Admitting: Physical Therapy

## 2020-06-20 ENCOUNTER — Ambulatory Visit: Payer: Medicare HMO | Admitting: Occupational Therapy

## 2020-06-20 DIAGNOSIS — R293 Abnormal posture: Secondary | ICD-10-CM

## 2020-06-20 DIAGNOSIS — R2689 Other abnormalities of gait and mobility: Secondary | ICD-10-CM | POA: Diagnosis not present

## 2020-06-20 DIAGNOSIS — R2681 Unsteadiness on feet: Secondary | ICD-10-CM

## 2020-06-20 NOTE — Patient Instructions (Signed)
Provided handout for supine shoulder press into mat, 5 reps and supine head press with chin tuck into pillow, x 5 reps, 1-2x/day

## 2020-06-20 NOTE — Therapy (Signed)
[facility] [address] [place], [place], [address] Phone: [phone]   Fax:  [phone]  Physical Therapy Treatment  Patient Details  Name: Antonio Cisneros MRN: [id number] Date of Birth: May 06, 1951 Referring Provider (PT): Morene Rankins, MD   Encounter Date: 06/20/2020   PT End of Session - 06/20/20 2022    Visit Number 4    Number of Visits 17    Date for PT Re-Evaluation 42/35/36   cert for 90 days, POC for 60 days   Authorization Type [employer] (auth needed and 10th visit PN needed)    Progress Note Due on Visit 10    PT Start Time 1618    PT Stop Time 1702    PT Time Calculation (min) 44 min    Equipment Utilized During Treatment Gait belt    Activity Tolerance Patient tolerated treatment well   No increase in neck pain throughout session   Behavior During Therapy [facility] for tasks assessed/performed;Impulsive           Past Medical History:  Diagnosis Date  . Depression   . PTSD (post-traumatic stress disorder)     Past Surgical History:  Procedure Laterality Date  . ANKLE FRACTURE SURGERY    . broken finger    . SINUSOTOMY      There were no vitals filed for this visit.   Subjective Assessment - 06/20/20 1623    Subjective No falls today, per pt report.  Still some falls, but not as many.  Wife reports not having to help as much trying to help him get out of the recliner.  Pt reports his neck is really bothering him today.    Patient is accompained by: Family member   Antonio Cisneros   Limitations Standing;Walking;House hold activities    How long can you stand comfortably? Less than 30 mins    How long can you walk comfortably? 5-10 mins    Patient Stated Goals "I'd like to stop this dizziheadedness"    Currently in Pain? Yes    Pain Score 3     Pain Location Neck    Pain Orientation Mid;Upper    Pain Descriptors / Indicators Aching    Pain Type Chronic pain    Pain Onset More than a month ago     Aggravating Factors  not sure, but aggravates his sleep    Pain Relieving Factors nothing really                             [facility] Adult PT Treatment/Exercise - 06/20/20 0001      Transfers   Transfers Sit to Stand;Stand to Sit    Sit to Stand 4: Min guard    Sit to Stand Details Verbal cues for technique;Verbal cues for precautions/safety;Verbal cues for sequencing;Visual cues/gestures for sequencing    Sit to Stand Details (indicate cue type and reason) Initial cues for sequence; pt demo good forward lean today, good speed of transfers through session, with only 1 episode of retropulsion with sit>stand at end of session.    Stand to Sit 4: Min guard    Stand to Sit Details (indicate cue type and reason) Verbal cues for sequencing;Verbal cues for technique    Comments 50 ft x 3 reps with gait, with wide U-turns for change of directions, then 80 ft with turn and change of direction with min guard assist.      Ambulation/Gait  Ambulation/Gait Yes    Ambulation/Gait Assistance 4: Min assist    Ambulation/Gait Assistance Details Cues for upright posture     Ambulation Distance (Feet) 230 Feet   60 ft into session   Assistive device None;1 person hand held assist    Gait Pattern Step-through pattern;Decreased arm swing - right;Decreased arm swing - left;Decreased stride length;Right flexed knee in stance;Left flexed knee in stance;Decreased trunk rotation;Trunk flexed    Ambulation Surface Level;Indoor      Exercises   Exercises Other Exercises    Other Exercises  Postural exercises in supine position:  shoulder press 2 sets x 5 reps, head press into pillow, with cues for chin tuck 5 reps x 2 sets.  Therapist assist with neck rotation and stretch at end range, 3 reps each direction.  Gentle distraction at occiput for strech, x 3 reps, no c/o pain throughout.               Balance Exercises - 06/20/20 0001      Balance Exercises: Standing   Stepping Strategy  Posterior;Anterior;Lateral;UE support;10 reps;Limitations    Stepping Strategy Limitations Cues for increased step length, weigthshifting and foot clearance    Retro Gait Upper extremity support;5 reps;Limitations    Retro Gait Limitations Forward/back walking at counter.  In forward direction, cues for upright posture, increased step length; in backward direction, cues for hinge at hips, equal/even step length.    Heel Raises Both;10 reps    Toe Raise Both;10 reps   Has 1 episode of posterior LOB, needs therapist assist   Other Standing Exercises Performed hip strategy work, facing counter, with "hinge" at hips in posterior then anterior weigthshift position, x 10 reps with therapist manual assist.  Stagger stance anterior/posterior weightshifting x 10 reps with therapist facilitation at hips for improved weightshfiting.          Reviewed HEP from last visit, lateral weightshifting x 10 reps and clock/quarter turns, 3 reps each side.  Pt needs cues to slow turns for improved foot clearance.   PT Education - 06/20/20 2021    Education Details postural exercises in supine    Person(s) Educated Patient;Spouse    Methods Explanation;Demonstration;Handout    Comprehension Verbalized understanding;Returned demonstration            PT Short Term Goals - 05/30/20 1836      PT SHORT TERM GOAL #1   Title Pt will be IND with initial HEP in order to indicate improved functional mobility and dec fall risk.  (Target Date: 06/29/20)    Time 4    Period Weeks    Status New    Target Date 06/29/20      PT SHORT TERM GOAL #2   Title Pt will improve TUG to </=13.50 secs with improved forward weight shift during sit>stand in order to indicate dec fall risk.    Time 4    Period Weeks    Status New      PT SHORT TERM GOAL #3   Title Pt will improve 5TSS to </=21 secs with support and improved forward weight shift in order to indicate dec fall risk.    Time 4    Period Weeks    Status New       PT SHORT TERM GOAL #4   Title Pt will ambulate with gait speed of >/=2.62 ft/sec w/ LRAD at mod I level with improved posture in order to indicate safe household and limited community mobility.    Time  4    Period Weeks    Status New      PT SHORT TERM GOAL #5   Title Will assess cervical spine and vestibular deficits and write appropriate LTGs when able.    Time 4    Period Weeks    Status New      Additional Short Term Goals   Additional Short Term Goals Yes      PT SHORT TERM GOAL #6   Title Will assess DGI and update STG/add LTG as appropriate to indicate dec fall risk.    Time 4    Period Weeks    Status New             PT Long Term Goals - 05/30/20 1840      PT LONG TERM GOAL #1   Title Pt will be IND with final HEP in order to indicate improved functional mobility and dec fall risk.  (Target Date: 07/29/20)    Time 8    Period Weeks    Status New    Target Date 07/29/20      PT LONG TERM GOAL #2   Title Pt will improve cognitive TUG to </=18 secs with improved sit>stand (LRAD as needed) in order to indicate dec fall risk.    Time 8    Period Weeks    Status New      PT LONG TERM GOAL #3   Title Pt will improve 5TSS to >/=17 secs with improved sit>stand (to device if needed) with support in order to indicate dec fall risk.    Time 8    Period Weeks    Status New      PT LONG TERM GOAL #4   Title Pt will perform push/release test with no more than 2 steps in order to indicate improved balance recovery with posterior LOB.    Time 8    Period Weeks    Status New                 Plan - 06/20/20 2022    Clinical Impression Statement Pt seems to have some carryover with improved sit<>stand transfers and wife reporting she is having to do less at home helping with transfers from recliner.  Performed postural stretching to pt's neck and upper traps area in supine, with pt reporting some relief after exercises and no additional pain in session.  Continued  standing work in weigthshifting in posterior direction and with turns; he will continue to need more practice for improved balance/decreased risk and frequency of falls.    Personal Factors and Comorbidities Comorbidity 3+;Time since onset of injury/illness/exacerbation    Comorbidities see above    Examination-Activity Limitations Locomotion Level;Reach Overhead;Lift;Transfers;Squat;Stairs;Stand    Examination-Participation Restrictions Cleaning;Community Activity;Yard Work    Conservation officer, historic buildings Evolving/Moderate complexity    Rehab Potential Fair    PT Frequency 2x / week    PT Duration 8 weeks    PT Treatment/Interventions ADLs/Self Care Home Management;Aquatic Therapy;Moist Heat;DME Instruction;Gait training;Stair training;Functional mobility training;Therapeutic activities;Therapeutic exercise;Balance training;Neuromuscular re-education;Cognitive remediation;Patient/family education;Manual techniques;Passive range of motion;Dry needling;Energy conservation;Taping;Vestibular;Visual/perceptual remediation/compensation    PT Next Visit Plan Review HEP from this visit; did not attempt walker, as pt's wife said they do not have one at home and doesn't feel it would be helpful with their home set-up; initial HEP for anterior weight shift/postural exercises, fall recvoery training with spouse.    Consulted and Agree with Plan of Care Patient;Family member/caregiver    Family Member  Consulted wife Antonio Cisneros           Patient will benefit from skilled therapeutic intervention in order to improve the following deficits and impairments:  Abnormal gait, Decreased activity tolerance, Decreased balance, Decreased cognition, Decreased endurance, Decreased knowledge of precautions, Decreased knowledge of use of DME, Decreased range of motion, Decreased safety awareness, Difficulty walking, Dizziness, Hypomobility, Impaired perceived functional ability, Impaired flexibility, Impaired  vision/preception, Postural dysfunction  Visit Diagnosis: Unsteadiness on feet  Abnormal posture  Other abnormalities of gait and mobility     Problem List Patient Active Problem List   Diagnosis Date Noted  . Physical deconditioning 07/02/2018  . Evaluation by psychiatric service required 06/22/2018  . History of depression 06/22/2018  . History of posttraumatic stress disorder (PTSD) 06/22/2018  . AKI (acute kidney injury) (HCC) 06/21/2018  . Allergic rhinitis 06/21/2018  . Dizziness 06/21/2018  . Dysphagia 06/21/2018  . Hypertension 06/21/2018  . Hand pain, right 01/29/2017  . Extensor tendon laceration of finger with open wound 01/29/2017  . Open displaced fracture of middle phalanx of left index finger with nonunion 10/22/2016  . Chronic pansinusitis 10/08/2016  . Nasal septal deviation 08/26/2016  . Nasal turbinate hypertrophy 08/26/2016  . Rhinorrhea 08/26/2016    Antonio Capell W. 06/20/2020, 8:26 PM  Antonio Maidens., PT   [facility] [address] [place], [place], [address] Phone: [phone]   Fax:  [phone]  Name: Antonio Cisneros MRN: [id number] Date of Birth: 23-Feb-1951

## 2020-06-26 ENCOUNTER — Ambulatory Visit: Payer: Medicare HMO | Admitting: Physical Therapy

## 2020-06-27 ENCOUNTER — Ambulatory Visit: Payer: Medicare HMO | Admitting: Physical Therapy

## 2020-06-27 ENCOUNTER — Encounter: Payer: Self-pay | Admitting: Physical Therapy

## 2020-06-27 ENCOUNTER — Ambulatory Visit: Payer: Medicare HMO | Admitting: Occupational Therapy

## 2020-06-27 ENCOUNTER — Other Ambulatory Visit: Payer: Self-pay

## 2020-06-27 DIAGNOSIS — R2681 Unsteadiness on feet: Secondary | ICD-10-CM

## 2020-06-27 DIAGNOSIS — R2689 Other abnormalities of gait and mobility: Secondary | ICD-10-CM | POA: Diagnosis not present

## 2020-06-27 DIAGNOSIS — M542 Cervicalgia: Secondary | ICD-10-CM

## 2020-06-27 DIAGNOSIS — R293 Abnormal posture: Secondary | ICD-10-CM

## 2020-06-27 NOTE — Therapy (Signed)
[facility] [address] [place], [place], [address] Phone: [phone]   Fax:  [phone]  Physical Therapy Treatment  Patient Details  Name: Antonio Cisneros MRN: [id number] Date of Birth: June 09, 1951 Referring Provider (PT): Morene Rankins, MD   Encounter Date: 06/27/2020   PT End of Session - 06/27/20 1845    Visit Number 5    Number of Visits 17    Date for PT Re-Evaluation 05/39/76   cert for 90 days, POC for 60 days   Authorization Type [employer] (auth needed and 10th visit PN needed)    Progress Note Due on Visit 10    PT Start Time 1704    PT Stop Time 1749    PT Time Calculation (min) 45 min    Equipment Utilized During Treatment Gait belt    Activity Tolerance Patient tolerated treatment well;No increased pain   Pt reports no pain in neck at end of session   Behavior During Therapy [facility] for tasks assessed/performed;Impulsive           Past Medical History:  Diagnosis Date  . Depression   . PTSD (post-traumatic stress disorder)     Past Surgical History:  Procedure Laterality Date  . ANKLE FRACTURE SURGERY    . broken finger    . SINUSOTOMY      There were no vitals filed for this visit.   Subjective Assessment - 06/27/20 1710    Subjective Have fallen about once per day.  Pt and wife report he is doing better with getting up from chairs, not needing as much assistance.    Patient is accompained by: Family member   [name]   Limitations Standing;Walking;House hold activities    How long can you stand comfortably? Less than 30 mins    How long can you walk comfortably? 5-10 mins    Patient Stated Goals "I'd like to stop this dizziheadedness"    Currently in Pain? Yes    Pain Score 2     Pain Location Neck    Pain Orientation Mid    Pain Descriptors / Indicators Aching    Pain Type Chronic pain    Pain Onset More than a month ago    Aggravating Factors  turning aggravates    Pain Relieving  Factors holding head steady, straightening neck out                             [facility] Adult PT Treatment/Exercise - 06/27/20 0001      Transfers   Transfers Floor to Transfer   Floor to Stand   Floor to Transfer 4: Min guard;With upper extremity assist    Comments Worked on floor>stand transfer:  pt transfers stand>tall kneeling and then goes into side sit position.  PT provides VCs for technique and min guard assist, as pt goes side sit to tall kneel>1/2 kneel>wide BOS standing with use of UE support at mat.  Educated wife in ways she could cue for rocking to gain momentum at certain points and definite need for UE support for optimal stability.      Ambulation/Gait   Ambulation/Gait Yes    Ambulation/Gait Assistance 4: Min guard    Ambulation/Gait Assistance Details Cues for arm swing and foot clearance.    Ambulation Distance (Feet) 345 Feet    Assistive device None    Gait Pattern Step-through pattern;Decreased arm swing - right;Decreased arm swing - left;Decreased  stride length;Right flexed knee in stance;Left flexed knee in stance;Decreased trunk rotation;Trunk flexed    Gait Comments Last lap around:  stop/start and short distance backwards walking, 3-4 steps, with cues to slow pace.  No overt LOB noted, with pt providing min guard throughout.      Exercises   Exercises Other Exercises    Other Exercises  Reviewed postural exercises in supine position:  shoulder press 10 reps, head press into pillow, with cues for chin tuck and brief hold,10 reps.  Therapist assist with neck rotation and stretch at end range, 5 reps each direction.  Gentle distraction at occiput for strech, x 8 reps, no c/o pain throughout.  Upper traps stretch in supine, 3 additional reps.           Supine shoulder depression, with therapist at pt's head and therapist pressing through shoulders to relax, x 10 reps.  Seated chin tuck, 2 sets x 5 reps, then neck rotation (cues each time to  initiate with chin tuck, then performed rotation 3 reps each direction, x 3 sets).  Attempted neck rotation with pt performing end range overpressures, but pt not able to sequence this correctly.  Cues given throughout exercises to relax through shoulders.  Seated PWR! Up x 10 reps -cues for upright posture and for technique Seated PWR! Rock x 5 reps each side-cues for technique and for turning head to look at hand  (Additional 2-3 reps performed of the above exercises when HEP handouts given)        PT Education - 06/27/20 1844    Education Details Additions to HEP-see instructions    Person(s) Educated Patient;Spouse    Methods Explanation;Demonstration;Handout    Comprehension Verbalized understanding;Returned demonstration;Verbal cues required            PT Short Term Goals - 05/30/20 1836      PT SHORT TERM GOAL #1   Title Pt will be IND with initial HEP in order to indicate improved functional mobility and dec fall risk.  (Target Date: 06/29/20)    Time 4    Period Weeks    Status New    Target Date 06/29/20      PT SHORT TERM GOAL #2   Title Pt will improve TUG to </=13.50 secs with improved forward weight shift during sit>stand in order to indicate dec fall risk.    Time 4    Period Weeks    Status New      PT SHORT TERM GOAL #3   Title Pt will improve 5TSS to </=21 secs with support and improved forward weight shift in order to indicate dec fall risk.    Time 4    Period Weeks    Status New      PT SHORT TERM GOAL #4   Title Pt will ambulate with gait speed of >/=2.62 ft/sec w/ LRAD at mod I level with improved posture in order to indicate safe household and limited community mobility.    Time 4    Period Weeks    Status New      PT SHORT TERM GOAL #5   Title Will assess cervical spine and vestibular deficits and write appropriate LTGs when able.    Time 4    Period Weeks    Status New      Additional Short Term Goals   Additional Short Term Goals  Yes      PT SHORT TERM GOAL #6   Title Will assess DGI and  update STG/add LTG as appropriate to indicate dec fall risk.    Time 4    Period Weeks    Status New             PT Long Term Goals - 05/30/20 1840      PT LONG TERM GOAL #1   Title Pt will be IND with final HEP in order to indicate improved functional mobility and dec fall risk.  (Target Date: 07/29/20)    Time 8    Period Weeks    Status New    Target Date 07/29/20      PT LONG TERM GOAL #2   Title Pt will improve cognitive TUG to </=18 secs with improved sit>stand (LRAD as needed) in order to indicate dec fall risk.    Time 8    Period Weeks    Status New      PT LONG TERM GOAL #3   Title Pt will improve 5TSS to >/=17 secs with improved sit>stand (to device if needed) with support in order to indicate dec fall risk.    Time 8    Period Weeks    Status New      PT LONG TERM GOAL #4   Title Pt will perform push/release test with no more than 2 steps in order to indicate improved balance recovery with posterior LOB.    Time 8    Period Weeks    Status New                 Plan - 06/27/20 1846    Clinical Impression Statement Pt and wife both report improved transfers at home; focused most of session today on postural exercises, positioning, and stretching, with pt having NO complaints of pain at end of session.  Wife had brought in report from recent c-spine xray (stenosis, mild disc bulge, no cord impingement).  Reinforced best posture and working on exercises to help reduce neck pain.  Pt continues to benefit from skilled PT to address balance, mobility deficits towards improved mobility and safety.    Personal Factors and Comorbidities Comorbidity 3+;Time since onset of injury/illness/exacerbation    Comorbidities see above    Examination-Activity Limitations Locomotion Level;Reach Overhead;Lift;Transfers;Squat;Stairs;Stand    Examination-Participation Restrictions Cleaning;Community Activity;Yard Work     Conservation officer, historic buildings Evolving/Moderate complexity    Rehab Potential Fair    PT Frequency 2x / week    PT Duration 8 weeks    PT Treatment/Interventions ADLs/Self Care Home Management;Aquatic Therapy;Moist Heat;DME Instruction;Gait training;Stair training;Functional mobility training;Therapeutic activities;Therapeutic exercise;Balance training;Neuromuscular re-education;Cognitive remediation;Patient/family education;Manual techniques;Passive range of motion;Dry needling;Energy conservation;Taping;Vestibular;Visual/perceptual remediation/compensation    PT Next Visit Plan Check STGs (I didn't get to this this visit!).  Review HEP and continue to work on gait, changes of direction and postural exercises.    Consulted and Agree with Plan of Care Patient;Family member/caregiver    Family Member Consulted wife [name]           Patient will benefit from skilled therapeutic intervention in order to improve the following deficits and impairments:  Abnormal gait,Decreased activity tolerance,Decreased balance,Decreased cognition,Decreased endurance,Decreased knowledge of precautions,Decreased knowledge of use of DME,Decreased range of motion,Decreased safety awareness,Difficulty walking,Dizziness,Hypomobility,Impaired perceived functional ability,Impaired flexibility,Impaired vision/preception,Postural dysfunction  Visit Diagnosis: Abnormal posture  Cervicalgia  Unsteadiness on feet     Problem List Patient Active Problem List   Diagnosis Date Noted  . Physical deconditioning 07/02/2018  . Evaluation by psychiatric service required 06/22/2018  . History of depression 06/22/2018  .  History of posttraumatic stress disorder (PTSD) 06/22/2018  . AKI (acute kidney injury) (HCC) 06/21/2018  . Allergic rhinitis 06/21/2018  . Dizziness 06/21/2018  . Dysphagia 06/21/2018  . Hypertension 06/21/2018  . Hand pain, right 01/29/2017  . Extensor tendon laceration of finger with open  wound 01/29/2017  . Open displaced fracture of middle phalanx of left index finger with nonunion 10/22/2016  . Chronic pansinusitis 10/08/2016  . Nasal septal deviation 08/26/2016  . Nasal turbinate hypertrophy 08/26/2016  . Rhinorrhea 08/26/2016    Kyah Buesing W. 06/27/2020, 6:50 PM  Gean Maidens., PT   [facility] [address] [place], [place], [address] Phone: [phone]   Fax:  [phone]  Name: Antonio Cisneros MRN: [id number] Date of Birth: 21-Nov-1950

## 2020-06-27 NOTE — Patient Instructions (Signed)
Access Code: [email or web address] URL: [email or web address] Date: 06/27/2020 Prepared by: Lonia Blood  Exercises Side to side weightshift - 1-2 x daily - 7 x weekly - 1 sets - 10 reps Standing Quarter Turn with Counter Support - 1 x daily - 7 x weekly - 1 sets - 10 reps  Added 06/27/2020:  Seated Cervical Retraction - 1-2 x daily - 7 x weekly - 1-2 sets - 10 reps - 3 sec hold Seated Cervical Retraction and Rotation - 1-2 x daily - 7 x weekly - 1-2 sets - 5 reps - 3 sec hold Also added pictures for seated PWR! Up x 10 reps Seated PWR! Rock x 5 reps each side

## 2020-07-03 ENCOUNTER — Ambulatory Visit: Payer: Medicare HMO | Admitting: Physical Therapy

## 2020-07-04 ENCOUNTER — Ambulatory Visit: Payer: Medicare HMO | Admitting: Physical Therapy

## 2020-07-04 ENCOUNTER — Encounter: Payer: Medicare HMO | Admitting: Occupational Therapy

## 2020-07-10 ENCOUNTER — Ambulatory Visit: Payer: Medicare HMO | Admitting: Physical Therapy

## 2020-07-11 ENCOUNTER — Encounter: Payer: Medicare HMO | Admitting: Occupational Therapy

## 2020-07-11 ENCOUNTER — Ambulatory Visit: Payer: Medicare HMO | Admitting: Physical Therapy

## 2020-07-16 ENCOUNTER — Ambulatory Visit: Payer: Medicare HMO | Admitting: Physical Therapy

## 2020-07-18 ENCOUNTER — Ambulatory Visit: Payer: Medicare HMO | Admitting: Physical Therapy

## 2020-07-23 ENCOUNTER — Ambulatory Visit: Payer: Medicare HMO | Attending: Neurology | Admitting: Physical Therapy

## 2020-07-23 DIAGNOSIS — R293 Abnormal posture: Secondary | ICD-10-CM | POA: Insufficient documentation

## 2020-07-23 DIAGNOSIS — R2681 Unsteadiness on feet: Secondary | ICD-10-CM | POA: Insufficient documentation

## 2020-07-23 DIAGNOSIS — R2689 Other abnormalities of gait and mobility: Secondary | ICD-10-CM | POA: Insufficient documentation

## 2020-07-25 ENCOUNTER — Ambulatory Visit: Payer: Medicare HMO | Admitting: Physical Therapy

## 2020-07-25 ENCOUNTER — Other Ambulatory Visit: Payer: Self-pay

## 2020-07-25 ENCOUNTER — Encounter: Payer: Self-pay | Admitting: Physical Therapy

## 2020-07-25 DIAGNOSIS — R293 Abnormal posture: Secondary | ICD-10-CM

## 2020-07-25 DIAGNOSIS — R2681 Unsteadiness on feet: Secondary | ICD-10-CM | POA: Diagnosis not present

## 2020-07-25 DIAGNOSIS — R2689 Other abnormalities of gait and mobility: Secondary | ICD-10-CM

## 2020-07-25 NOTE — Therapy (Signed)
[place] [address] [place], [place], [address] Phone: [phone]   Fax:  [phone]  Physical Therapy Treatment/Discharge Summary  Patient Details  Name: Antonio Cisneros MRN: [id number] Date of Birth: March 05, 1951 Referring Provider (PT): Regis Bill, MD   Encounter Date: 07/25/2020   PT End of Session - 07/25/20 1835    Visit Number 6    Number of Visits 17    Date for PT Re-Evaluation 94/17/40   cert for 90 days, POC for 60 days   Authorization Type [employer] (auth needed and 10th visit PN needed)    Progress Note Due on Visit 10    PT Start Time 1621    PT Stop Time 1700    PT Time Calculation (min) 39 min    Equipment Utilized During Treatment Gait belt    Activity Tolerance Patient tolerated treatment well;No increased pain   Pt reports no pain in neck at end of session   Behavior During Therapy [facility] for tasks assessed/performed;Impulsive           Past Medical History:  Diagnosis Date  . Depression   . PTSD (post-traumatic stress disorder)     Past Surgical History:  Procedure Laterality Date  . ANKLE FRACTURE SURGERY    . broken finger    . SINUSOTOMY      There were no vitals filed for this visit.   Subjective Assessment - 07/25/20 1623    Subjective Still falling about once per day.  Things about the same.  Don't feel like things are getting better.    Patient is accompained by: Family member   [name]   Limitations Standing;Walking;House hold activities    How long can you stand comfortably? Less than 30 mins    How long can you walk comfortably? 5-10 mins    Patient Stated Goals "I'd like to stop this dizziheadedness"    Currently in Pain? Yes    Pain Score 2     Pain Location Generalized    Pain Descriptors / Indicators Aching    Pain Type Chronic pain    Pain Onset More than a month ago    Pain Frequency Constant                 Reviewed HEP:   Exercises (NMR) Side  to side weightshift - 1-2 x daily - 7 x weekly - 1 sets - 10 reps Standing Quarter Turn with Counter Support - 1 x daily - 7 x weekly - 1 sets - 10 reps  Added 06/27/2020:  Seated Cervical Retraction - 1-2 x daily - 7 x weekly - 1-2 sets - 10 reps - 3 sec hold Seated Cervical Retraction and Rotation - 1-2 x daily - 7 x weekly - 1-2 sets - 5 reps - 3 sec hold Also added pictures for seated PWR! Up x 10 reps Seated PWR! Rock x 5 reps each side  Also reviewed supine neck retraction x 5 reps and supine shoulder press (scapular retraction) x 5 reps.  Pt able to return demo with cues            [facility] Adult PT Treatment/Exercise - 07/25/20 0001      Transfers   Transfers Sit to Stand;Stand to Sit    Sit to Stand 4: Min guard    Sit to Stand Details Verbal cues for technique;Verbal cues for precautions/safety;Verbal cues for sequencing;Visual cues/gestures for sequencing    Sit to Stand Details (indicate  cue type and reason) Pt has 3 episodes of posterior lean, pushing through legs at mat (moves mat posteriorly) or chair (PT holding chair in place).  Needs cues to perform slowly without posterior lean.    Five time sit to stand comments  23.06    Stand to Sit 4: Min guard      Ambulation/Gait   Ambulation/Gait Yes    Ambulation/Gait Assistance 4: Min guard    Gait velocity 10.04 sec =3.27 ft/sec      Standardized Balance Assessment   Standardized Balance Assessment Timed Up and Go Test      Timed Up and Go Test   TUG Normal TUG;Cognitive TUG    Normal TUG (seconds) 13.31    Cognitive TUG (seconds) 14.72      High Level Balance   High Level Balance Comments Posterior push and release test:  5 steps 1st trial, 4 steps 2nd trial, pt able to stop himself to regain balance.  Pt does report this is how he falls at home, sometimes 10 steps and then he falls.               Balance Exercises - 07/25/20 0001      Balance Exercises: Standing   Stepping Strategy Posterior;UE  support;10 reps    Stepping Strategy Limitations Cues for technique and for weigthshifting    Heel Raises Both;10 reps    Other Standing Exercises Stagger stance forward/back rocking x 10 reps with initial cues for weightshifting through hips.             PT Education - 07/25/20 1834    Education Details Discussed progress towards goals and pt being at continued fall risk, transfer safety and safety awareness; pt in agreement he would like to d/c today.    Person(s) Educated Patient;Spouse    Methods Explanation    Comprehension Verbalized understanding            PT Short Term Goals - 07/25/20 1835      PT SHORT TERM GOAL #1   Title Pt will be IND with initial HEP in order to indicate improved functional mobility and dec fall risk.  (Target Date: 06/29/20)    Baseline Needs cues for technique    Time 4    Period Weeks    Status Not Met    Target Date 06/29/20      PT SHORT TERM GOAL #2   Title Pt will improve TUG to </=13.50 secs with improved forward weight shift during sit>stand in order to indicate dec fall risk.    Baseline 13.31    Time 4    Period Weeks    Status Achieved      PT SHORT TERM GOAL #3   Title Pt will improve 5TSS to </=21 secs with support and improved forward weight shift in order to indicate dec fall risk.    Baseline 23.06    Time 4    Period Weeks    Status Not Met      PT SHORT TERM GOAL #4   Title Pt will ambulate with gait speed of >/=2.62 ft/sec w/ LRAD at mod I level with improved posture in order to indicate safe household and limited community mobility.    Baseline 3.27 ft/sec min guard    Time 4    Period Weeks    Status Partially Met      PT SHORT TERM GOAL #5   Title Will assess cervical spine and vestibular deficits and  write appropriate LTGs when able.    Baseline Addressed cervical spine with exercises    Time 4    Period Weeks    Status Deferred      PT SHORT TERM GOAL #6   Title Will assess DGI and update STG/add LTG  as appropriate to indicate dec fall risk.    Time 4    Period Weeks    Status Deferred             PT Long Term Goals - 07/25/20 1837      PT LONG TERM GOAL #1   Title Pt will be IND with final HEP in order to indicate improved functional mobility and dec fall risk.  (Target Date: 07/29/20)    Time 8    Period Weeks    Status Not Met      PT LONG TERM GOAL #2   Title Pt will improve cognitive TUG to </=18 secs with improved sit>stand (LRAD as needed) in order to indicate dec fall risk.    Time 8    Period Weeks    Status Achieved      PT LONG TERM GOAL #3   Title Pt will improve 5TSS to >/=17 secs with improved sit>stand (to device if needed) with support in order to indicate dec fall risk.    Time 8    Period Weeks    Status Not Met      PT LONG TERM GOAL #4   Title Pt will perform push/release test with no more than 2 steps in order to indicate improved balance recovery with posterior LOB.    Time 8    Period Weeks    Status Not Met                 Plan - 07/25/20 1837    Clinical Impression Statement Pt has not been seen for PT since 06/27/2020, as pt had other appointments and cancelled (last visit, pt did not show).  In discussing with pt and wife, despite some improvements in objective measures, he does not feel he notices a change in his mobility.  He continues to report one fall per day and wife reports impulsivity leads to many of his falls.  Most of the sessions have focused on education for mobility patterns to help decrease falls, but pt demo decreased carryover, especially with transfer technique and safety.  Assessed STGs this visit, with pt meeting STG 2 and partially meeting STG 4, STG 1 and 3 not met; STG 5 and 6 deferred.  LTGs assessed today, but not fully able to be addressed, as pt would like to d/c this visit.    Personal Factors and Comorbidities Comorbidity 3+;Time since onset of injury/illness/exacerbation    Comorbidities see above     Examination-Activity Limitations Locomotion Level;Reach Overhead;Lift;Transfers;Squat;Stairs;Stand    Examination-Participation Restrictions Cleaning;Community Activity;Yard Work    [employer] Evolving/Moderate complexity    Rehab Potential Fair    PT Frequency 2x / week    PT Duration 8 weeks    PT Treatment/Interventions ADLs/Self Care Home Management;Aquatic Therapy;Moist Heat;DME Instruction;Gait training;Stair training;Functional mobility training;Therapeutic activities;Therapeutic exercise;Balance training;Neuromuscular re-education;Cognitive remediation;Patient/family education;Manual techniques;Passive range of motion;Dry needling;Energy conservation;Taping;Vestibular;Visual/perceptual remediation/compensation    PT Next Visit Plan D/C this visit.    Consulted and Agree with Plan of Care Patient;Family member/caregiver    Family Member Consulted wife Davy Pique           Patient will benefit from skilled therapeutic intervention in order to  improve the following deficits and impairments:  Abnormal gait,Decreased activity tolerance,Decreased balance,Decreased cognition,Decreased endurance,Decreased knowledge of precautions,Decreased knowledge of use of DME,Decreased range of motion,Decreased safety awareness,Difficulty walking,Dizziness,Hypomobility,Impaired perceived functional ability,Impaired flexibility,Impaired vision/preception,Postural dysfunction  Visit Diagnosis: Unsteadiness on feet  Other abnormalities of gait and mobility  Abnormal posture     Problem List Patient Active Problem List   Diagnosis Date Noted  . Physical deconditioning 07/02/2018  . Evaluation by psychiatric service required 06/22/2018  . History of depression 06/22/2018  . History of posttraumatic stress disorder (PTSD) 06/22/2018  . AKI (acute kidney injury) ([place]) 06/21/2018  . Allergic rhinitis 06/21/2018  . Dizziness 06/21/2018  . Dysphagia 06/21/2018  . Hypertension  06/21/2018  . Hand pain, right 01/29/2017  . Extensor tendon laceration of finger with open wound 01/29/2017  . Open displaced fracture of middle phalanx of left index finger with nonunion 10/22/2016  . Chronic pansinusitis 10/08/2016  . Nasal septal deviation 08/26/2016  . Nasal turbinate hypertrophy 08/26/2016  . Rhinorrhea 08/26/2016    Jolette Lana W. 07/25/2020, 6:41 PM  Frazier Butt., PT   [place] [address] [place], [place], [address] Phone: [phone]   Fax:  [phone]  Name: Antonio Cisneros MRN: [id number] Date of Birth: 1951/04/20   PHYSICAL THERAPY DISCHARGE SUMMARY  Visits from Start of Care: 6  Current functional level related to goals / functional outcomes:  PT Short Term Goals - 07/25/20 1835      PT SHORT TERM GOAL #1   Title Pt will be IND with initial HEP in order to indicate improved functional mobility and dec fall risk.  (Target Date: 06/29/20)    Baseline Needs cues for technique    Time 4    Period Weeks    Status Not Met    Target Date 06/29/20      PT SHORT TERM GOAL #2   Title Pt will improve TUG to </=13.50 secs with improved forward weight shift during sit>stand in order to indicate dec fall risk.    Baseline 13.31    Time 4    Period Weeks    Status Achieved      PT SHORT TERM GOAL #3   Title Pt will improve 5TSS to </=21 secs with support and improved forward weight shift in order to indicate dec fall risk.    Baseline 23.06    Time 4    Period Weeks    Status Not Met      PT SHORT TERM GOAL #4   Title Pt will ambulate with gait speed of >/=2.62 ft/sec w/ LRAD at mod I level with improved posture in order to indicate safe household and limited community mobility.    Baseline 3.27 ft/sec min guard    Time 4    Period Weeks    Status Partially Met      PT SHORT TERM GOAL #5   Title Will assess cervical spine and vestibular deficits and write appropriate LTGs  when able.    Baseline Addressed cervical spine with exercises    Time 4    Period Weeks    Status Deferred      PT SHORT TERM GOAL #6   Title Will assess DGI and update STG/add LTG as appropriate to indicate dec fall risk.    Time 4    Period Weeks    Status Deferred           PT Long Term Goals - 07/25/20 6546  PT LONG TERM GOAL #1   Title Pt will be IND with final HEP in order to indicate improved functional mobility and dec fall risk.  (Target Date: 07/29/20)    Time 8    Period Weeks    Status Not Met      PT LONG TERM GOAL #2   Title Pt will improve cognitive TUG to </=18 secs with improved sit>stand (LRAD as needed) in order to indicate dec fall risk.    Time 8    Period Weeks    Status Achieved      PT LONG TERM GOAL #3   Title Pt will improve 5TSS to >/=17 secs with improved sit>stand (to device if needed) with support in order to indicate dec fall risk.    Time 8    Period Weeks    Status Not Met      PT LONG TERM GOAL #4   Title Pt will perform push/release test with no more than 2 steps in order to indicate improved balance recovery with posterior LOB.    Time 8    Period Weeks    Status Not Met             Remaining deficits: Decreased safety awareness, decreased balance and decreased timing and coordination with gait; continued falls   Education / Equipment: Educated in [employer], Psychologist, forensic, turning techniques  Plan: Patient agrees to discharge.  Patient goals were not met. Patient is being discharged due to the patient's request.  ?????          Mady Haagensen, PT 07/25/20 6:43 PM Phone: [phone] Fax: [phone]

## 2021-04-05 ENCOUNTER — Other Ambulatory Visit: Payer: Self-pay

## 2021-04-05 ENCOUNTER — Ambulatory Visit: Payer: Medicare HMO | Attending: Family Medicine

## 2021-04-05 DIAGNOSIS — R1313 Dysphagia, pharyngeal phase: Secondary | ICD-10-CM | POA: Insufficient documentation

## 2021-04-05 NOTE — Patient Instructions (Signed)
   Important to find source of jaw pain   If not able to do this, meet with dietician or nutritionist to see about softer foods/drinks with high protein/high calories  More consistent reminders about double swallows for all bites and sips, small bites and sips, and SLOW DOWN!  Think about smaller portions more frequently throughout the day instead of 3 larger meals/portions to minimize fatigue   =========================================== I will let Shanda Bumps decide if a swallow test is necessary

## 2021-04-05 NOTE — Therapy (Signed)
[facility] [address] Antonio Cisner[place], [place], [address] Phone: [phone]   Fax:  [phone]  Speech Language Pathology Evaluation  Patient Details  Name: Antonio Cisneros MRN: [id number] Date of Birth: Apr 04, 1951 No data recorded  Encounter Date: 04/05/2021   End of Session - 04/05/21 2244     Visit Number 1    Number of Visits 1    Date for SLP Re-Evaluation 04/05/21    SLP Start Time 1405    SLP Stop Time  1445    SLP Time Calculation (min) 40 min    Activity Tolerance Patient tolerated treatment well             Past Medical History:  Diagnosis Date   Depression    PTSD (post-traumatic stress disorder)     Past Surgical History:  Procedure Laterality Date   ANKLE FRACTURE SURGERY     broken finger     SINUSOTOMY      There were no vitals filed for this visit.       SLP Evaluation [facility] - 04/05/21 0001       Oral Motor/Sensory Function   Overall Oral Motor/Sensory Function Impaired    Labial ROM Reduced right;Reduced left   better with visual cue   Labial Symmetry Within Functional Limits    Labial Strength --   CNT due to limited ability to pucker lips   Labial Coordination Reduced    Lingual ROM Reduced right;Reduced left    Lingual Symmetry Within Functional Limits    Lingual Strength --   difficult to assess   Lingual Coordination Reduced    Velum Impaired right;Impaired left                             SLP Education - 04/05/21 2242     Education Details See body of eval note from today for details    Person(s) Educated Patient;Spouse    Methods Explanation    Comprehension Verbalized understanding                  Plan - 04/05/21 2245     Clinical Impression Statement Antonio Cisneros [facility]") today presents with mild pharyngeal dysphagia as measured by FEES at [facility] on 07-24-20. See that report for details in "Care Everywhere". Today his primary  complaint is jaw/tooth pain on rt, which has impacted his ability to eat food which needs to be chewed. Antonio Cisneros has lost approx 20 lbs in two years. Initially pt and wife denied pt coughed during meals and just coughed after meals, but after SLP explained further wife said pt coughed during meals regularly. Pt was provided 2 small bites of a cereal bar broken off by the SLP, which he mashed (due to jaw/tooth pain) and swallowed without overt s/sx aspiration. Oral residue was not present. SLP shared pt may want to inquire of PCP about a dietician/nutriitonist who could suggest some high protein/high calorie nutrition for pt, if pt/wife cannot ascertain the reason for pt's jaw/tooth pain on/in rt mandible. Pt and wife deny any overt s/sx of aspiration PNA in last 6 months. SLP strongly suggested pt ask his PCP for advice about jaw/tooth pain.  Notably, pt grabbed for cereal bar in SLP hand as SLP tearing a small piece off for pt, and did not perform double swallow as directed after FEES. With liquids, SLP purposely did not say anything about precautions to assess pt's  adherence to precautions. He followed none of the precautions for liquids with 7 liquid boluses, and took 2 larger-size sips before SLP cued pt to take small bites (which SLP controlled without pt overt difficulty), adn small sips. After this admonishment from SLP to pt, he cont to take larger sips and one sip was the size of half of what remained in the cup - approx 3 oz.. Pt coughed after this sip and SLP used that as a reiteration point for pt to take small sips "1/2 teaspoon" was recommended, more or less for pt to have an image in his mind when he takes a cup sip. SLP had to cue for small sips and double swallows each sip pt took, and in two instances it took pt 17 seconds to achieve a second (dry) swallow. Wife has not been cueing pt at home, but SLP told wife she would need to begin to cue pt for double swallows, slow rate of eating, and small bites  and sips. She told SLP she was comfortable with this task. SLP also provided other cueing techniques such as gentle touch on arm to control impulsivity for more than one bite of food in oral cavity at a time, and parsing out bites so pt does ont stuff his mouth. AT THIS TIME, THE NEED FOR A FOLLOW UP OBJECTIVE SWALLOW ASSESSMENT WILL BE LEFT TO PT'S FNP at [facility].    Speech Therapy Frequency One time visit    Consulted and Agree with Plan of Care Patient;Family member/caregiver    Family Member Consulted wife             Patient will benefit from skilled therapeutic intervention in order to improve the following deficits and impairments:   Dysphagia, pharyngeal phase    Problem List Patient Active Problem List   Diagnosis Date Noted   Physical deconditioning 07/02/2018   Evaluation by psychiatric service required 06/22/2018   History of depression 06/22/2018   History of posttraumatic stress disorder (PTSD) 06/22/2018   AKI (acute kidney injury) ([facility]) 06/21/2018   Allergic rhinitis 06/21/2018   Dizziness 06/21/2018   Dysphagia 06/21/2018   Hypertension 06/21/2018   Hand pain, right 01/29/2017   Extensor tendon laceration of finger with open wound 01/29/2017   Open displaced fracture of middle phalanx of left index finger with nonunion 10/22/2016   Chronic pansinusitis 10/08/2016   Nasal septal deviation 08/26/2016   Nasal turbinate hypertrophy 08/26/2016   Rhinorrhea 08/26/2016    [facility] 04/05/2021, 11:11 PM  [place] [facility] [address] [place], [place], [address] Phone: [phone]   Fax:  [phone]  Name: Antonio Cisneros MRN: [id number] Date of Birth: 09-May-1951

## 2021-12-13 DIAGNOSIS — R531 Weakness: Secondary | ICD-10-CM | POA: Diagnosis not present

## 2021-12-13 DIAGNOSIS — Z87891 Personal history of nicotine dependence: Secondary | ICD-10-CM | POA: Diagnosis not present

## 2021-12-13 DIAGNOSIS — R4182 Altered mental status, unspecified: Secondary | ICD-10-CM | POA: Diagnosis not present

## 2021-12-13 DIAGNOSIS — H53149 Visual discomfort, unspecified: Secondary | ICD-10-CM | POA: Diagnosis not present

## 2021-12-13 DIAGNOSIS — I10 Essential (primary) hypertension: Secondary | ICD-10-CM | POA: Diagnosis not present

## 2021-12-13 DIAGNOSIS — R5383 Other fatigue: Secondary | ICD-10-CM | POA: Diagnosis not present

## 2021-12-14 DIAGNOSIS — R531 Weakness: Secondary | ICD-10-CM | POA: Diagnosis not present

## 2022-03-21 DIAGNOSIS — G4733 Obstructive sleep apnea (adult) (pediatric): Secondary | ICD-10-CM | POA: Diagnosis not present

## 2022-03-21 DIAGNOSIS — R296 Repeated falls: Secondary | ICD-10-CM | POA: Diagnosis not present

## 2022-03-21 DIAGNOSIS — G231 Progressive supranuclear ophthalmoplegia [Steele-Richardson-Olszewski]: Secondary | ICD-10-CM | POA: Diagnosis not present

## 2022-06-03 DIAGNOSIS — I6523 Occlusion and stenosis of bilateral carotid arteries: Secondary | ICD-10-CM | POA: Diagnosis not present

## 2022-06-03 DIAGNOSIS — F32A Depression, unspecified: Secondary | ICD-10-CM | POA: Diagnosis not present

## 2022-06-03 DIAGNOSIS — M542 Cervicalgia: Secondary | ICD-10-CM | POA: Diagnosis not present

## 2022-06-03 DIAGNOSIS — Z9181 History of falling: Secondary | ICD-10-CM | POA: Diagnosis not present

## 2022-06-03 DIAGNOSIS — N1831 Chronic kidney disease, stage 3a: Secondary | ICD-10-CM | POA: Diagnosis not present

## 2022-06-03 DIAGNOSIS — G231 Progressive supranuclear ophthalmoplegia [Steele-Richardson-Olszewski]: Secondary | ICD-10-CM | POA: Diagnosis not present

## 2022-07-08 DIAGNOSIS — Z1152 Encounter for screening for COVID-19: Secondary | ICD-10-CM | POA: Diagnosis not present

## 2022-07-08 DIAGNOSIS — B379 Candidiasis, unspecified: Secondary | ICD-10-CM | POA: Diagnosis not present

## 2022-07-08 DIAGNOSIS — R0602 Shortness of breath: Secondary | ICD-10-CM | POA: Diagnosis not present

## 2022-07-08 DIAGNOSIS — I10 Essential (primary) hypertension: Secondary | ICD-10-CM | POA: Diagnosis not present

## 2022-07-08 DIAGNOSIS — R059 Cough, unspecified: Secondary | ICD-10-CM | POA: Diagnosis not present

## 2022-07-08 DIAGNOSIS — R32 Unspecified urinary incontinence: Secondary | ICD-10-CM | POA: Diagnosis not present

## 2022-07-08 DIAGNOSIS — G231 Progressive supranuclear ophthalmoplegia [Steele-Richardson-Olszewski]: Secondary | ICD-10-CM | POA: Diagnosis not present

## 2022-07-08 DIAGNOSIS — Z20822 Contact with and (suspected) exposure to covid-19: Secondary | ICD-10-CM | POA: Diagnosis not present

## 2022-07-08 DIAGNOSIS — J309 Allergic rhinitis, unspecified: Secondary | ICD-10-CM | POA: Diagnosis not present

## 2022-07-08 DIAGNOSIS — R0981 Nasal congestion: Secondary | ICD-10-CM | POA: Diagnosis not present

## 2022-08-14 DEATH — deceased
